# Patient Record
Sex: Female | Born: 1990 | Hispanic: Yes | Marital: Single | Smoking: Never smoker
Health system: Southern US, Community
[De-identification: ages and names within clinical notes are randomized; demographics above are authoritative.]

## PROBLEM LIST (undated history)

## (undated) DIAGNOSIS — Z789 Other specified health status: Secondary | ICD-10-CM

## (undated) HISTORY — PX: APPENDECTOMY: SHX54

---

## 2010-06-15 ENCOUNTER — Ambulatory Visit: Payer: Self-pay | Admitting: Family Medicine

## 2010-06-15 ENCOUNTER — Encounter: Payer: Self-pay | Admitting: Family Medicine

## 2010-06-15 LAB — CONVERTED CEMR LAB
Antibody Screen: NEGATIVE
Basophils Absolute: 0 K/uL
Basophils Relative: 0 %
Eosinophils Absolute: 0.1 K/uL
Eosinophils Relative: 2 %
HCT: 37.3 %
Hemoglobin: 12.3 g/dL
Hepatitis B Surface Ag: NEGATIVE
Lymphocytes Relative: 23 %
Lymphs Abs: 1.2 K/uL
MCHC: 33 g/dL
MCV: 89 fL
Monocytes Absolute: 0.4 K/uL
Monocytes Relative: 7 %
Neutro Abs: 3.6 K/uL
Neutrophils Relative %: 68 %
Platelets: 168 K/uL
RBC: 4.19 M/uL
RDW: 13.3 %
Rh Type: POSITIVE
Rubella: 35.3 [IU]/mL — ABNORMAL HIGH
Sickle Cell Screen: NEGATIVE
WBC: 5.3 10*3/microliter

## 2010-06-22 ENCOUNTER — Ambulatory Visit: Payer: Self-pay | Admitting: Family Medicine

## 2010-06-22 ENCOUNTER — Encounter: Payer: Self-pay | Admitting: Family Medicine

## 2010-06-22 DIAGNOSIS — N898 Other specified noninflammatory disorders of vagina: Secondary | ICD-10-CM | POA: Insufficient documentation

## 2010-06-22 LAB — CONVERTED CEMR LAB
Chlamydia, DNA Probe: NEGATIVE
GC Probe Amp, Genital: NEGATIVE
Pap Smear: NEGATIVE
Whiff Test: NEGATIVE

## 2010-07-06 ENCOUNTER — Encounter: Payer: Self-pay | Admitting: Family Medicine

## 2010-07-14 ENCOUNTER — Telehealth (INDEPENDENT_AMBULATORY_CARE_PROVIDER_SITE_OTHER): Payer: Self-pay | Admitting: Family Medicine

## 2010-07-14 ENCOUNTER — Telehealth: Payer: Self-pay | Admitting: Family Medicine

## 2010-07-24 ENCOUNTER — Ambulatory Visit: Payer: Self-pay | Admitting: Family Medicine

## 2010-07-27 ENCOUNTER — Telehealth: Payer: Self-pay | Admitting: *Deleted

## 2010-07-31 ENCOUNTER — Encounter: Payer: Self-pay | Admitting: Family Medicine

## 2010-07-31 ENCOUNTER — Ambulatory Visit (HOSPITAL_COMMUNITY): Admission: RE | Admit: 2010-07-31 | Discharge: 2010-07-31 | Payer: Self-pay | Admitting: Family Medicine

## 2010-08-24 ENCOUNTER — Ambulatory Visit: Payer: Self-pay | Admitting: Family Medicine

## 2010-09-21 ENCOUNTER — Ambulatory Visit: Payer: Self-pay | Admitting: Family Medicine

## 2010-09-21 ENCOUNTER — Encounter: Payer: Self-pay | Admitting: Family Medicine

## 2010-09-21 LAB — CONVERTED CEMR LAB
HCT: 35.9 % — ABNORMAL LOW (ref 36.0–46.0)
Hemoglobin: 11.4 g/dL — ABNORMAL LOW (ref 12.0–15.0)
MCHC: 31.8 g/dL (ref 30.0–36.0)
MCV: 92.3 fL (ref 78.0–100.0)
Platelets: 154 10*3/uL (ref 150–400)
RBC: 3.89 M/uL (ref 3.87–5.11)
RDW: 13.8 % (ref 11.5–15.5)
WBC: 7.8 10*3/uL (ref 4.0–10.5)

## 2010-10-11 ENCOUNTER — Ambulatory Visit: Payer: Self-pay | Admitting: Family Medicine

## 2010-10-13 ENCOUNTER — Inpatient Hospital Stay (HOSPITAL_COMMUNITY): Admission: AD | Admit: 2010-10-13 | Discharge: 2010-10-13 | Payer: Self-pay | Admitting: Obstetrics & Gynecology

## 2010-10-13 DIAGNOSIS — O2390 Unspecified genitourinary tract infection in pregnancy, unspecified trimester: Secondary | ICD-10-CM

## 2010-10-13 DIAGNOSIS — R109 Unspecified abdominal pain: Secondary | ICD-10-CM

## 2010-10-13 DIAGNOSIS — N390 Urinary tract infection, site not specified: Secondary | ICD-10-CM

## 2010-10-18 ENCOUNTER — Telehealth (INDEPENDENT_AMBULATORY_CARE_PROVIDER_SITE_OTHER): Payer: Self-pay | Admitting: *Deleted

## 2010-10-19 ENCOUNTER — Ambulatory Visit: Payer: Self-pay | Admitting: Family Medicine

## 2010-10-19 DIAGNOSIS — R1031 Right lower quadrant pain: Secondary | ICD-10-CM | POA: Insufficient documentation

## 2010-10-24 ENCOUNTER — Encounter: Payer: Self-pay | Admitting: Family Medicine

## 2010-10-24 ENCOUNTER — Ambulatory Visit: Payer: Self-pay | Admitting: Family Medicine

## 2010-11-08 ENCOUNTER — Ambulatory Visit: Payer: Self-pay | Admitting: Family Medicine

## 2010-11-23 ENCOUNTER — Ambulatory Visit: Payer: Self-pay | Admitting: Family Medicine

## 2010-11-23 ENCOUNTER — Encounter: Payer: Self-pay | Admitting: Family Medicine

## 2010-11-24 ENCOUNTER — Encounter: Payer: Self-pay | Admitting: Family Medicine

## 2010-11-24 LAB — CONVERTED CEMR LAB
Chlamydia, DNA Probe: NEGATIVE
GC Probe Amp, Genital: NEGATIVE

## 2010-12-06 ENCOUNTER — Ambulatory Visit: Payer: Self-pay | Admitting: Family Medicine

## 2010-12-14 ENCOUNTER — Ambulatory Visit: Admission: RE | Admit: 2010-12-14 | Discharge: 2010-12-14 | Payer: Self-pay | Source: Home / Self Care

## 2010-12-19 ENCOUNTER — Telehealth: Payer: Self-pay | Admitting: Family Medicine

## 2010-12-20 ENCOUNTER — Ambulatory Visit: Admission: RE | Admit: 2010-12-20 | Discharge: 2010-12-20 | Payer: Self-pay | Source: Home / Self Care

## 2010-12-25 ENCOUNTER — Encounter: Payer: Self-pay | Admitting: Family Medicine

## 2010-12-25 ENCOUNTER — Ambulatory Visit
Admission: RE | Admit: 2010-12-25 | Discharge: 2010-12-25 | Payer: Self-pay | Source: Home / Self Care | Attending: Family Medicine | Admitting: Family Medicine

## 2010-12-25 DIAGNOSIS — H538 Other visual disturbances: Secondary | ICD-10-CM | POA: Insufficient documentation

## 2010-12-25 LAB — CONVERTED CEMR LAB
Chlamydia, DNA Probe: NEGATIVE
GC Probe Amp, Genital: NEGATIVE
Whiff Test: NEGATIVE

## 2010-12-26 ENCOUNTER — Telehealth: Payer: Self-pay | Admitting: *Deleted

## 2010-12-26 ENCOUNTER — Ambulatory Visit
Admission: RE | Admit: 2010-12-26 | Discharge: 2010-12-26 | Payer: Self-pay | Source: Home / Self Care | Attending: Obstetrics & Gynecology | Admitting: Obstetrics & Gynecology

## 2010-12-29 ENCOUNTER — Ambulatory Visit: Admission: RE | Admit: 2010-12-29 | Discharge: 2010-12-29 | Payer: Self-pay | Source: Home / Self Care

## 2010-12-29 ENCOUNTER — Ambulatory Visit
Admission: RE | Admit: 2010-12-29 | Discharge: 2010-12-29 | Payer: Self-pay | Source: Home / Self Care | Attending: Obstetrics and Gynecology | Admitting: Obstetrics and Gynecology

## 2010-12-31 ENCOUNTER — Inpatient Hospital Stay (HOSPITAL_COMMUNITY)
Admission: AD | Admit: 2010-12-31 | Discharge: 2011-01-03 | Payer: Self-pay | Source: Home / Self Care | Attending: Obstetrics and Gynecology | Admitting: Obstetrics and Gynecology

## 2011-01-02 ENCOUNTER — Ambulatory Visit: Admit: 2011-01-02 | Payer: Self-pay | Admitting: Family Medicine

## 2011-01-02 LAB — RAPID HIV SCREEN (WH-MAU): Rapid HIV Screen: NONREACTIVE

## 2011-01-02 LAB — CBC
HCT: 37.4 % (ref 36.0–46.0)
HCT: 32 % — ABNORMAL LOW (ref 36.0–46.0)
Hemoglobin: 12.8 g/dL (ref 12.0–15.0)
Hemoglobin: 11 g/dL — ABNORMAL LOW (ref 12.0–15.0)
MCH: 29.6 pg (ref 26.0–34.0)
MCH: 29.6 pg (ref 26.0–34.0)
MCHC: 34.2 g/dL (ref 30.0–36.0)
MCHC: 34.4 g/dL (ref 30.0–36.0)
MCV: 86.6 fL (ref 78.0–100.0)
MCV: 86 fL (ref 78.0–100.0)
Platelets: 107 10*3/uL — ABNORMAL LOW (ref 150–400)
Platelets: 105 10*3/uL — ABNORMAL LOW (ref 150–400)
RBC: 4.32 MIL/uL (ref 3.87–5.11)
RBC: 3.72 MIL/uL — ABNORMAL LOW (ref 3.87–5.11)
RDW: 12.9 % (ref 11.5–15.5)
RDW: 12.9 % (ref 11.5–15.5)
WBC: 9.1 10*3/uL (ref 4.0–10.5)
WBC: 12 10*3/uL — ABNORMAL HIGH (ref 4.0–10.5)

## 2011-01-02 LAB — DIFFERENTIAL
Basophils Absolute: 0 10*3/uL (ref 0.0–0.1)
Basophils Relative: 0 % (ref 0–1)
Eosinophils Absolute: 0.1 10*3/uL (ref 0.0–0.7)
Eosinophils Relative: 1 % (ref 0–5)
Lymphocytes Relative: 18 % (ref 12–46)
Lymphs Abs: 1.6 10*3/uL (ref 0.7–4.0)
Monocytes Absolute: 0.9 10*3/uL (ref 0.1–1.0)
Monocytes Relative: 9 % (ref 3–12)
Neutro Abs: 6.6 10*3/uL (ref 1.7–7.7)
Neutrophils Relative %: 72 % (ref 43–77)

## 2011-01-02 LAB — TYPE AND SCREEN
ABO/RH(D): A POS
Antibody Screen: NEGATIVE

## 2011-01-02 LAB — ABO/RH: ABO/RH(D): A POS

## 2011-01-02 LAB — RUBELLA SCREEN: Rubella: 25.2 IU/mL — ABNORMAL HIGH

## 2011-01-02 LAB — HEPATITIS B SURFACE ANTIGEN: Hepatitis B Surface Ag: NEGATIVE

## 2011-01-02 LAB — RPR: RPR Ser Ql: NONREACTIVE

## 2011-01-02 LAB — AMNISURE RUPTURE OF MEMBRANE (ROM) NOT AT ARMC: Amnisure ROM: NEGATIVE

## 2011-01-05 ENCOUNTER — Inpatient Hospital Stay (HOSPITAL_COMMUNITY)
Admission: AD | Admit: 2011-01-05 | Discharge: 2011-01-05 | Payer: Self-pay | Source: Home / Self Care | Attending: Obstetrics & Gynecology | Admitting: Obstetrics & Gynecology

## 2011-01-09 NOTE — Progress Notes (Signed)
Summary: phn msg  Phone Note Call from Patient Call back at [facility] Phone [phone]   Caller: Patient Summary of Call: NOB having pink d/c since yesterday - not sure if that is normal Initial call taken by: De Nurse,  July 14, 2010 10:41 AM  Follow-up for Phone Call        spoke with patient and she reports pink discharge started yesterday. she goes to bathroom  to urinate and after she stands and washs her hands she feels the discharge come out. has happened several times like this. no cramping , vaginal itching.  had not had intercourse prior to this . Dr. Sheffield Slider advised and he does not feels patient needs to come in at this time given no contractions , etc,. she has a regularly scheduled appointment with PCP 07/20/2010.  patient asks about regular activities such as cleaning, walking , dancing. advise just  general light hosekeeping is fine , no heavy lifting , hold off on dancing for now until she sees pcp next week. Follow-up by: Theresia Lo RN,  July 14, 2010 11:18 AM  Additional Follow-up for Phone Call Additional follow up Details #1::        Will ask Dr Edmonia James to call her this PM to see if she wants her to see her in her clinic Additional Follow-up by: Zachery Dauer MD,  July 14, 2010 12:12 PM

## 2011-01-09 NOTE — Miscellaneous (Signed)
Summary: Orders Update  Clinical Lists Changes  Orders: Added new Test order of Other OB visit- [facility]) - Signed

## 2011-01-09 NOTE — Progress Notes (Signed)
  Phone Note Outgoing Call   Call placed by: Ferne Coe Call placed to: Patient Summary of Call: I called pt and let her know about her [place] appt at [facility] on 08/22 @1 :00pm

## 2011-01-09 NOTE — Assessment & Plan Note (Signed)
Summary: OB visit 29.2 weeks   Vital Signs:  Patient profile:   20 year old female Weight:      126 pounds Pulse rate:   87 / minute BP sitting:   111 / 74  (left arm) Cuff size:   regular  Vitals Entered By: Tessie Fass CMA (October 11, 2010 3:59 PM) CC: OB Visit   Primary Care Provider:  Ellin Mayhew MD  CC:  OB Visit.  History of Present Illness: Pt reports that she has vaginal discharge off and on during pregnancy.  She denies itching or foul odor.  She has had unprotected sex with FOB.  She states that she is not 100% sure that she is his only partner.  I recommended that we screen again for STD's but pt states that she didn't want a pelvic exam at today's visit. we reviewed the importance of protection during intercourse and the importance of screening for STD's during pregnancy.  Habits & Providers  Alcohol-Tobacco-Diet     Cigarette Packs/Day: n/a  Current Medications (verified): 1)  Prenatal/folic Acid  Tabs (Prenatal Vit-Fe Fumarate-Fa) .... Take 1 Tablet Daily  Allergies (verified): No Known Drug Allergies  Review of Systems       no fever. no n/v.  see ob flowsheet.  Physical Exam  General:  VSS Well-developed,well-nourished,in no acute distress; alert,appropriate and cooperative throughout examination Lungs:  Normal respiratory effort, chest expands symmetrically. Lungs are clear to auscultation, no crackles or wheezes. Heart:  Normal rate and regular rhythm. S1 and S2 normal without gallop, murmur, click, rub or other extra sounds.   Impression & Recommendations:  Problem # 1:  PREGNANCY, NORMAL (ICD-V22.2)  Pt is a G1P0- EDC 12/25/10 based on 19 week U/S.  Lab work:  A+, antibody-neg, rubella- immune, HIV- NR.    Pt states that she will allow me to do pelvic exam at next visit and screening for STD's.  Will review pain med options again at next visit.  Also will discuss pros of breastfeeding and birth control options at next appt.  BP WNL today  and weight gain appropriate.    Orders: Other OB visit- [facility] ([facility])  Complete Medication List: 1)  Prenatal/folic Acid Tabs (Prenatal vit-fe fumarate-fa) .... Take 1 tablet daily  Patient Instructions: 1)  Please make a follow up appt for in 2 weeks. 2)  Great to see you today!   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 years    Menses interval: irregular days    Menstrual flow 5 days    On BCP's at conception: no   Flowsheet View for Follow-up Visit    Estimated weeks of       gestation:     29 2/7    Weight:     126    Blood pressure:   111 / 74    Headache:     No    Nausea/vomiting:   No    Edema:     0    Vaginal bleeding:   no  Vaginal discharge:   d/c    Fundal height:      29    FHR:       140's    Fetal activity:     yes    Labor symptoms:   no    Taking prenatal vits?   Y    Smoking:     n/a    Next visit:     2 wk    Resident:     [facility] for Follow-up Visit    Estimated weeks of       gestation:     29 2/7    Weight:     126    Blood pressure:   111 / 74    Hx headache?     No    Nausea/vomiting?   No    Edema?     0    Bleeding?     no    Leakage/discharge?   d/c    Fetal activity:       yes    Labor symptoms?   no    Fundal height:      29    FHR:       140's    Taking Vitamins?   Y    Smoking PPD:   n/a    Next visit:     2 wk    Resident:     Edmonia James

## 2011-01-09 NOTE — Assessment & Plan Note (Signed)
Summary: left and low back discomfort, f/U ED visit 10/13/2010/ls   Vital Signs:  Patient profile:   20 year old female Weight:      128 pounds Temp:     98.4 degrees F oral Pulse rate:   83 / minute BP sitting:   105 / 67  (left arm) Cuff size:   regular  Vitals Entered By: Garen Grams LPN (October 19, 2010 9:38 AM) CC: Ed f/u for UTI and abd pain ob 30.4 Is Patient Diabetic? No Pain Assessment Patient in pain? no        Primary Care Provider:  Ellin Mayhew MD  CC:  Ed f/u for UTI and abd pain ob 30.4.  History of Present Illness: 1. Pregnant and lower abdominal pain: - Pt is [redacted] weeks pregnant.  She was seen at [facility] because of abdominal pain.  She was told that everything with the baby was fine but that she might have a UTI.  She was given a Rx of Keflex and took it for 5 days but then lost the prescription.  She was called by a nurse from [facility] saying that she didn't have a UTI and that she didn't need to take the abx.  The pain is located in the RLQ and in the right pelvis.  She denies any pain today.  The pain comes and goes.  Sometimes associated with movement.  Currently rated a 0/10.  ROS: denies dysuria, hematuria, back pain, vaginal discharge, vaginal itching, nausea, vomitting, diarrhea.  endorses good fetal movement.  Habits & Providers  Alcohol-Tobacco-Diet     Cigarette Packs/Day: n/a  Allergies: No Known Drug Allergies  Past History:  Past Medical History: Reviewed history from 06/22/2010 and no changes required. none  Social History: Reviewed history from 06/22/2010 and no changes required. Lives with boyfriend. pt's family in [place], boyfriends family local no alcohol, drugs, smoking.  Physical Exam  General:  VSS Well-developed,well-nourished,in no acute distress; alert,appropriate and cooperative throughout examination Lungs:  Normal respiratory effort, chest expands symmetrically. Lungs are clear to auscultation, no crackles or  wheezes. Heart:  Normal rate and regular rhythm. S1 and S2 normal without gallop, murmur, click, rub or other extra sounds. Abdomen:  gravid c/w dates.  Nontender.  No guarding or rebound. Skin:  turgor normal and color normal.   Psych:  not anxious appearing and not depressed appearing.   Additional Exam:  Reviewed UA and UCx results.  UCx with no growth.   Impression & Recommendations:  Problem # 1:  ABDOMINAL PAIN RIGHT LOWER QUADRANT (ICD-789.03) Assessment Improved Currently resolved.  Sounds more like round ligament pain.  No need to restart abx.  Continue to monitor.  F/U with PCP on Tuesday.  Problem # 2:  PREGNANCY, NORMAL (ICD-V22.2) Assessment: Unchanged  Doing well.  Good FHTs and good fetal movement.  F/U with PCP on Tuesday.  Orders: Other OB visit- [facility] ([facility])  Complete Medication List: 1)  Prenatal/folic Acid Tabs (Prenatal vit-fe fumarate-fa) .... 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 years    Menses interval: irregular days    Menstrual flow 5 days    On BCP's at conception: no   Flowsheet View for Follow-up Visit    Estimated weeks of       gestation:     30 3/7    Weight:     128    Blood pressure:   105 / 67    Headache:     No    Nausea/vomiting:   No    Edema:     0    Vaginal bleeding:   no    Vaginal discharge:   no    Fundal height:      30    FHR:       125    Fetal activity:     yes    Labor symptoms:   no    Taking prenatal vits?   Y    Smoking:     n/a    Next visit:     1 wk    Resident:     Lelon Perla

## 2011-01-09 NOTE — Assessment & Plan Note (Signed)
Summary: OB visit at 31 weeks   Vital Signs:  Patient profile:   20 year old female Weight:      127.9 pounds BP sitting:   108 / 75  Vitals Entered By: Arlyss Repress CMA, (October 24, 2010 1:33 PM)  Habits & Providers  Alcohol-Tobacco-Diet     Cigarette Packs/Day: n/a  Allergies: No Known Drug Allergies   Impression & Recommendations:  Problem # 1:  PREGNANCY, NORMAL (ICD-V22.2) 31 week OB visit: Pt is a G1P0- EDC 12/25/10 based on 19 week U/S.  Lab work:  A+, antibody-neg, rubella- immune, HIV- NR.    approriate weight gain and BP. pt denies any vaginal discharge.   Boy- "Ethelene Browns" pain management- epidural feeding- unsure about breastfeeding/ leaning towards formula  at next appt: will have pt set up an appt with OB clinic, discuss birth control options, discuss if she would like a circumcision for anthony, continue to encourage breast feeding.  Complete Medication List: 1)  Prenatal/folic Acid Tabs (Prenatal vit-fe fumarate-fa) .... 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 years    Menses interval: irregular days    Menstrual flow 5 days    On BCP's at conception: no   Flowsheet View for Follow-up Visit    Estimated weeks of       gestation:     31 1/7    Weight:     127.9    Blood pressure:   108 / 75    Headache:     No    Nausea/vomiting:   No    Edema:     0    Vaginal bleeding:   no    Vaginal discharge:   no    Fundal height:      31    FHR:       135    Fetal activity:     yes    Labor symptoms:   no    Taking prenatal vits?   Y  Smoking:     n/a    Next visit:     2 wk    Resident:     [facility] for Follow-up Visit    Estimated weeks of       gestation:     31 1/7    Weight:     127.9    Blood pressure:   108 / 75    Hx headache?     No    Nausea/vomiting?   No    Edema?     0    Bleeding?     no    Leakage/discharge?   no    Fetal activity:       yes    Labor symptoms?   no    Fundal height:      31    FHR:       135    Taking Vitamins?   Y    Smoking PPD:  n/a    Next visit:     2 wk    Resident:     Edmonia James

## 2011-01-09 NOTE — Assessment & Plan Note (Signed)
Summary: OB at 20.5 weeks   Vital Signs:  Patient profile:   20 year old female Weight:      111 pounds Pulse rate:   81 / minute BP sitting:   109 / 68  (left arm) Cuff size:   regular  Vitals Entered By: Tessie Fass CMA (July 24, 2010 2:14 PM) CC: OB Visit   CC:  OB Visit.  History of Present Illness: pt endorses some tearfulness during past few weeks.  Wants to know if this is normal.  Also asks if it is ok if she dances and exercises.  No   Habits & Providers  Alcohol-Tobacco-Diet     Cigarette Packs/Day: n/a  Allergies: No Known Drug Allergies  Physical Exam  Psych:  PHQ-9 score of 2 pregnancy medical home form- no red flags.   Impression & Recommendations:  Problem # 1:  PREGNANCY, NORMAL (ICD-V22.2) pt is a G1P0 at 20.5 weeks. PHQ-9 obtained- score of 2-  reassured pt that many women feel more tearful/emotional during pregnancy but that there is also an increased risk of depression among preg women so we need to monitor closely for this.  PHQ9 reassuring.  Will give repeat PHQ9 in future if needed to reassess.  Pt encouraged to exercise.  Instructed to avoid activities that would cause her to fall or to be hit in abdomen.   bp wnl.  weight gain appropriate.  Now has orange card so will obtain u/s.  Pt will need to be scheduled at [facility] clinic for next appt.   Orders: Other OB visit- [facility] ([facility]) Prenatal U/S > 14 weeks - 91478 (Prenatal U/S)  Patient Instructions: 1)  Think about who you would like to have for your baby's doctor. 2)  [email or web address] 3)  Your due date is 12/06/10--- 4)  return in 4 weeks for your next appt.    Flowsheet View for Follow-up Visit    Estimated weeks of       gestation:     20 5/7    Weight:     111    Blood pressure:   109 / 68    Hx headache?     +    Nausea/vomiting?   No    Edema?     0    Bleeding?     no    Leakage/discharge?   no    Fetal activity:       no    Labor symptoms?   no    Fundal height:      at  umbilicus    FHR:       140s    Taking Vitamins?   Y    Smoking PPD:   n/a    Next visit:     4 wk    Resident:     Edmonia James    Preceptor:     Mauricio Po

## 2011-01-09 NOTE — Assessment & Plan Note (Signed)
Summary: OB visit- 33.2 weeks   Vital Signs:  Patient profile:   20 year old female Weight:      131.4 pounds Temp:     98.8 degrees F oral Pulse rate:   86 / minute BP sitting:   104 / 69  (right arm) Cuff size:   regular  Vitals Entered By: Jimmy Footman, CMA (November 08, 2010 2:18 PM) CC: OB 33    2/7 Is Patient Diabetic? No Pain Assessment Patient in pain? no        CC:  OB 33    2/7.  Habits & Providers  Alcohol-Tobacco-Diet     Cigarette Packs/Day: n/a  Allergies: No Known Drug Allergies   Impression & Recommendations:  Problem # 1:  PREGNANCY, NORMAL (ICD-V22.2)  31 week OB visit: Pt is a G1P0- EDC 12/25/10 based on 19 week U/S.  Lab work:  A+, antibody-neg, rubella- immune, HIV- NR.    approriate weight gain and BP. Pt reports some rib pain off and on but no abd or rib pain on exam.    Boy- "Ethelene Browns" pain management- epidural feeding- unsure about breastfeeding- states that she will try to breastfeed at first Birth control- depo  at next appt: pt states that she would like me to do the pelvic exam for group b strep culture, will do this and GC/Chlam at next appt. will have pt set up an appt with OB clinic at following appt,  at next appt will discuss if she would like a circumcision for [facility] and will ask who will be anthony's doctor. continue to encourage breast feeding.  Orders: Other OB visit- [facility] ([facility])  Complete Medication List: 1)  Prenatal/folic Acid Tabs (Prenatal vit-fe fumarate-fa) .... Take 1 tablet daily  Patient Instructions: 1)  return in 2 weeks for next appt.  Make an appt with the OB clinic.   Orders Added: 1)  Other OB visit- [facility] [[facility]]     Flowsheet View for Follow-up Visit    Estimated weeks of       gestation:     33 2/7    Weight:     131.4    Blood pressure:   104 / 69    Hx headache?     No    Nausea/vomiting?   No    Edema?     0    Bleeding?     no    Leakage/discharge?   no    Fetal activity:       yes  Labor symptoms?   no    Fundal height:      33    FHR:       130s    Fetal position:      vertex    Taking Vitamins?   Y    Smoking PPD:   n/a    Next visit:     2 wk    Resident:     Edmonia James

## 2011-01-09 NOTE — Assessment & Plan Note (Signed)
Summary: OB visit 22.4   Vital Signs:  Patient profile:   20 year old female LMP:     03/01/2010 Weight:      117.8 pounds BP sitting:   109 / 74  History of Present Illness: Pt states that she doesn have white/clear discharge from time to time.  No itching.  No foul odor.  No exposure to STD's.  Offered to check for infection but pt declined at this time stating that she is not at risk of any STD's since no expousure.  No foul odor or itching so not symptomatic BV or yeast.   Pt states that she feel while getting something out of the refridgerator about 3 weeks ago. She did not get evaluated.  Pt has been moving well.  We talked about the importance of going to the emergency dept after a fall for evaluation.    Pt states that she has been less tearful and emotional.  Denies any problems with depression or "feeling down"  Instructed to let me know if she does have any return of these problems.   Habits & Providers  Alcohol-Tobacco-Diet     Cigarette Packs/Day: `n/a  Current Medications (verified): 1)  Prenatal/folic Acid  Tabs (Prenatal Vit-Fe Fumarate-Fa) .... Take 1 Tablet Daily  Allergies (verified): No Known Drug Allergies  Social History: Packs/Day:  `n/a  Review of Systems       as per hpi and ob flowsheet.  Physical Exam  General:  VSS Well-developed,well-nourished,in no acute distress; alert,appropriate and cooperative throughout examination Lungs:  Normal respiratory effort, chest expands symmetrically.  Abdomen:  gravid Extremities:  no edema Psych:  Cognition and judgment appear intact. Alert and cooperative with normal attention span and concentration. No apparent delusions, illusions, hallucinations   Impression & Recommendations:  Problem # 1:  PREGNANCY, NORMAL (ICD-V22.2) Pt given due date of 12/25/10 based on U/S at 19 weeks.  Pt had LMP on 03/01/10 but has irregular periods so this is not reliable- EDC based on LMP is  12/06/10.  Pt due date changed in  record to 12/25/10.  Will ensure that pt knows about this change at next appt.  Pt is to return in 4 weeks to be seen at [facility] clinic. Will do glucola at next visit.   Vaginal d/c most likely physiologic.  Pt instructed to go to ER if any falls in the future.  Will continue to monitor for depression.  No red flags at this time.    Orders: Other OB visit- [facility] ([facility])  Complete Medication List: 1)  Prenatal/folic Acid Tabs (Prenatal vit-fe fumarate-fa) .... Take 1 tablet daily  Patient Instructions: 1)  return for next appt in 4 weeks- make appt with the OB clinic. 2)  If you have any vaginal bleeding or abd pain [facility].   3)  You can always call the emergency line here at the clinic if any questions or concerns.  4)  We will get the glucola (diabetes) test at your next appt.    Flowsheet View for Follow-up Visit    Estimated weeks of       gestation:     22 4/7    Weight:     117.8    Blood pressure:   109 / 74    Hx headache?     No    Nausea/vomiting?   No    Edema?     0    Bleeding?     no  Leakage/discharge?   d/c    Fetal activity:       yes    Labor symptoms?   no    Fundal height:      22    FHR:       140    Taking Vitamins?   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 years    Menses interval: irregular days    Menstrual flow 5  days    On BCP's at conception: no   Flowsheet View for Follow-up Visit    Estimated weeks of       gestation:     22 4/7    Weight:     117.8    Blood pressure:   109 / 74    Headache:     No    Nausea/vomiting:   No    Edema:     0    Vaginal bleeding:   no    Vaginal discharge:   d/c    Fundal height:      22    FHR:       140    Fetal activity:     yes    Labor symptoms:   no    Taking prenatal vits?   Y    Smoking:     `n/a    Next visit:     4 wk    Resident:     Edmonia James    Preceptor:     Alfonse Ras

## 2011-01-09 NOTE — Assessment & Plan Note (Signed)
Summary: 26 weeks   Vital Signs:  Patient profile:   20 year old female Weight:      120.9 pounds BP sitting:   112 / 78  Vitals Entered By: Arlyss Repress CMA, (September 21, 2010 11:03 AM)  Habits & Providers  Alcohol-Tobacco-Diet     Cigarette Packs/Day: n/a  Allergies: No Known Drug Allergies  Social History: Packs/Day:  n/a   Impression & Recommendations:  Problem # 1:  PREGNANCY, NORMAL (ICD-V22.2) Doing well.  Revewed ultrasound with pt and partner.  Reviewed PTL precautions and kick counts.  Flu shot today.  Will check varicella titers as pt is unsure if she has had the disease or vaccine.  F/u 3 weeks with Dr. Edmonia James. Orders: CBC-[facility] (16109) HIV-[facility] ([id number]) RPR-[facility] [id number]) Glucose 1 hr-[facility] (13086) Miscellaneous Lab Charge-[facility] (57846) Other OB visit- [facility] ([facility])  Complete Medication List: 1)  Prenatal/folic Acid Tabs (Prenatal vit-fe fumarate-fa) .... Take 1 tablet daily  Patient Instructions: 1)  If you have bleeding, if your water breaks, if the baby is not moving well, or if you have contractions, please go to [facility] to be checked. 2)  We will check some labs today, including the sugar test to look for diabetes. 3)  Please make an appt in 3 weeks with Dr. Katrinka Blazing.   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 years    Menses interval: irregular days    Menstrual flow 5 days    On BCP's at conception: no   Flowsheet View for Follow-up Visit    Estimated weeks of       gestation:     26 3/7    Weight:     120.9    Blood pressure:   112 / 78    Headache:     No    Nausea/vomiting:   No    Edema:     0    Vaginal bleeding:    no    Vaginal discharge:   no    Fundal height:      26    FHR:       130s    Fetal activity:     yes    Taking prenatal vits?   Y    Smoking:     n/a  Prenatal Visit Concerns noted: Wants to know u/s results.  Doing well.  No other concerns.     Orders Added: 1)  CBC-[facility] [85027] 2)  HIV-[facility] [[id number]] 3)  RPR-[facility] [[id number]] 4)  Glucose 1 hr-[facility] [82950] 5)  Miscellaneous Lab Charge-[facility] [99999] 6)  Other OB visit- [facility] [[facility]]

## 2011-01-09 NOTE — Miscellaneous (Signed)
Summary: clinical update  Clinical Lists Changes  Observations: Added new observation of MENARCHE: ? 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 years    Menses interval: irregular days    Menstrual flow 5 days    On BCP's at conception: no

## 2011-01-09 NOTE — Assessment & Plan Note (Signed)
Summary: NOB   Vital Signs:  Patient profile:   20 year old female LMP:     03/01/2010 Weight:      106.5 pounds BP sitting:   105 / 69  Vitals Entered By: Jimmy Footman, CMA (June 22, 2010 3:42 PM) CC: New OB visit Is Patient Diabetic? No LMP (date): 03/01/2010 EDC by LMP==> 12/06/2010 EDC 12/06/2010 LMP - Reliable? Yes Menarche (age onset years): 3   Menses interval (days): irregular Menstrual flow (days): 5 On BCP's at conception: no Enter LMP: 03/01/2010   CC:  New OB visit.  History of Present Illness: Pt states that she is here for her first prenatal visit.  She states that she doesn't have any concerns with this pregnancy.  She states that she is having some morning sickness but that it is tolerable.    Habits & Providers  Alcohol-Tobacco-Diet     Tobacco Status: never     Cigarette Packs/Day: n/a  Current Medications (verified): 1)  None  Allergies (verified): No Known Drug Allergies  Past History:  Past Medical History: none  Past Surgical History: Appendectomy  Family History: maternal GM- Cancer brain no family hx of diabetes Father- high cholesterol  Social History: Lives with boyfriend. pt's family in [place], boyfriends family local no alcohol, drugs, smoking. Smoking Status:  never Education:  12 years Packs/Day:  n/a Hepatitis Risk:  no  Physical Exam  General:  VSS Well-developed,well-nourished,in no acute distress; alert,appropriate and cooperative throughout examination Lungs:  Normal respiratory effort, chest expands symmetrically. Lungs are clear to auscultation, no crackles or wheezes. Heart:  Normal rate and regular rhythm. S1 and S2 normal without gallop, murmur, click, rub or other extra sounds. Abdomen:  soft, non-tender, and normal bowel sounds.  fundal height approx 16cm Genitalia:  Pelvic Exam:        External: normal female genitalia without lesions or masses        Vagina: normal without lesions or masses  Cervix: normal without lesions or masses        Adnexa: normal bimanual exam without masses or fullness        Uterus: approrpiate for gestational age.        Pap smear: performed Msk:  normal gait Pulses:  2 + Extremities:  no edema Psych:  No signs or symptoms of depression.Oriented X3, normally interactive, and good eye contact.     Impression & Recommendations:  Problem # 1:  PREGNANCY, NORMAL (ICD-V22.2) Pt is a  20 y.o. G1 P0 who is here for a NOB appt.  Pt EDC 12/06/10  based on LMP.  This makes her 16.[redacted] weeks pregnant at this appt.  Will order U/S once pt  has applied for [facility] card in order to eval for dates.  Pt is sure of LMP but periods are irregular and unreliable at baseline.  Did not discuss 2nd trimester genetic screening with pt at today's appt.  Will call pt and discuss these options with her.  All routine prenatal screening labs reviewed.  No red flags.   Urine Cx negative.  Pt given red flags for return.  Pt is a good canidate for prject launch at next appt will see if her baby will be coming to our clinic for care, if so will refer pt.  Pt encouraged to eat small frequent meals and continue prenatal vitamins.  Orders: GC/Chlamydia-[facility] ([id number]) Pap Smear-[facility] ([id number]) Other OB visit- [facility] ([facility])  [id number]  Patient Instructions: 1)  please call and set up an appt in  4 weeks.  2)  Currently your due date is 12/06/10 but we need to confirm with ultrasound since your periods are usually irregular.   3)  After you get your orange card we can schedule you for an ultrasound so do this ASAP. 4)  If any vaginal bleeding, vaginal discharge, contractions please call the clinic    Flowsheet View for Follow-up Visit    Estimated weeks of       gestation:     16 1/7    Weight:     106.5    Blood pressure:   105 / 69    Hx headache?     few    Nausea/vomiting?   vom1/d    Edema?     0    Bleeding?     no    Leakage/discharge?    d/c    Fetal activity:       no    Labor symptoms?   no    Fundal height:      16    FHR:       140s    Taking Vitamins?   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 : Yes    Menarche: 3 years    Menses interval: irregular days    Menstrual flow 5 days    On BCP's at conception: no    Pre Pregnancy Weight: 103 lbs.    Symptoms since LMP: amenorrhea, nausea, vomiting, fatigue, irritability, tender breasts, urinary frequency   Flowsheet View for Follow-up Visit    Estimated weeks of       gestation:     16 1/7    Weight:     106.5    Blood pressure:   105 / 69    Headache:     few    Nausea/vomiting:   vom1/d    Edema:     0    Vaginal bleeding:   no    Vaginal discharge:   d/c    Fundal height:      16    FHR:       140s    Fetal activity:     no    Labor symptoms:   no    Taking prenatal vits?   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 : Yes    Menarche: 3 years    Menses interval: irregular days    Menstrual flow 5  days    On BCP's at conception: no    Pre Pregnancy Weight: 103 lbs.    Symptoms since LMP: amenorrhea, nausea, vomiting, fatigue, irritability, tender breasts, urinary frequency   Past Pregnancy History    Gravida:     1    Term Births:     0    Premature Births:   0    Para:       0    Mult. Births:     0    Prev C-Section:   0    Aborta:     0    Elect. Ab:     0    Spont. Ab:     0  Pregnancy # 1    Comments:     current pregnacy   Genetic History    Father of baby:   Huel Cote     Thalassemia:     mother: no   father: no    Neural tube defect:   mother: no   father: no    Down's Syndrome:   mother: no   father: no    Tay-Sachs:     mother: no   father: no    Sickle Cell Dz/Trait:   mother: no   father: no    Hemophilia:     mother: no   father: no    Muscular Dystrophy:   mother: no   father: no    Cystic Fibrosis:   mother: no   father: no    Huntington's Dz:   mother: no   father: no    Mental Retardation:   mother: no   father: no    Fragile X:     mother: no   father: no    Other Genetic or       Chromosomal Dz:   mother: no   father: no    Child with other       birth defect:     mother: no   father: no    > 3 spont. abortions:   mother: no    Hx of stillbirth:     mother: no  Infection Risk History    High Risk Hepatitis B: no    Exposure to TB: no    Patient with history of Genital Herpes: no    Sexual partner with history of Genital Herpes: no    History of STD (GC, Chlamydia, Syphilis, HPV): no    Rash, Viral, or Febrile Illness since LMP: yes    Exposure to Cat Litter: no    Chicken Pox Immune Status: Hx of Disease: Immune    History of Parvovirus (Fifth Disease): no  Environmental Exposures    Xray Exposure since LMP: no    Chemical or other exposure: no    Medication, drug, or alcohol use since LMP: no  Additional Infection/Environmental Comments:    had runny nose and sore throat 1 month ago. No fever  Cleaning chemical exposure  only    Prenatal Visit    FOB name: Huel Cote  EDC Confirmation:    [facility]: 12/06/2010    LMP reliable? Yes    Last menses onset (LMP) date: 03/01/2010    [facility] by LMP: 12/06/2010    Laboratory Results  Date/Time Received: June 22, 2010 4:36PM  Date/Time Reported: June 22, 2010 5:03 PM   [employer] Source: vaginal WBC/hpf: 1-5 Bacteria/hpf: 2+  Rods Clue cells/hpf: none  Negative whiff Yeast/hpf: none Trichomonas/hpf: none Comments: ...........test performed by...........Marland KitchenTerese Door, CMA    Appended Document: NOB Attempt to call pt and discuss genetic screening options--phone number in computer incorrect/wrong number. Keyuna Cuthrell S.Edmonia James, MD

## 2011-01-09 NOTE — Progress Notes (Signed)
Summary: phn msg  Phone Note Call from Patient Call back at [facility] Phone [phone] Call back at [phone]   Caller: Interpretor-Noellea Summary of Call: Pt has orange card now and the ultra sound can now be scheduled. Initial call taken by: Clydell Hakim,  July 14, 2010 11:42 AM

## 2011-01-09 NOTE — Progress Notes (Signed)
Summary: meds prob  Phone Note Call from Patient Call back at Home Phone [phone]   Caller: Patient Summary of Call: pt went [facility] ED on Friday and they gave her some meds that she has to take and her husband threw the bottle away and she needs to take it for the next 2 days- not sure what she should do Initial call taken by: De Nurse,  October 18, 2010 3:06 PM  Follow-up for Phone Call        recieved records from [facility]  and patient also states she was given kelfex . she was seen in ED 10/13/2010 and took antibiotic for 4 days . husband discarded by mistake.  will send to preceptor to ask if he will refill . pharmacy is [facility] [address] Follow-up by: Theresia Lo RN,  October 18, 2010 3:20 PM  Additional Follow-up for Phone Call Additional follow up Details #1::        Dr. Sheffield Slider reviewed records and urine culture rport showed no growth. he states patient does not need any further antibiotic for UTI. patient notified . she states she is still having same symptom, discomfort left hip low back area. appointment scheduled tomorrow for recheck. Additional Follow-up by: Theresia Lo RN,  October 18, 2010 3:34 PM

## 2011-01-11 NOTE — Miscellaneous (Signed)
Summary: use of eyedrops  Clinical Lists Changes had irritated eyes & bought eyedrops. visine. read on back to ask md. told her I would check with md & call her back.Golden Circle RN  December 25, 2010 10:19 AM  I don't know what formulation of Visine she has, but most are category C in pregnancy (we don't have much information about the effects of the medicine on pregnancy).  It is probably best to not use them in pregnancy if she can avoid it. Sarah Swaziland MD  December 25, 2010 10:36 AM  told her above & advised seeing md. then she told me she was having difficulty seeing out of one eye, thinks there is something in it. she will be here in 20 minutes to see work in [employer] RN  December 25, 2010 10:40 AM

## 2011-01-11 NOTE — Progress Notes (Signed)
Summary: called pt/ts  ---- Converted from flag ---- ---- 12/26/2010 9:12 AM, Arlyss Repress CMA, wrote:   ---- 12/26/2010 9:10 AM, Milinda Antis MD wrote: Please call pt today and let her know all of her studies for her discharge ge were normal, her gonorrhea and chlamydia were neg, so was her wet prep for trichomonas ------------------------------  called pt and informed. Marland KitchenMarland Kitchen

## 2011-01-11 NOTE — Assessment & Plan Note (Signed)
Summary: 39.2 OB visit  pt agreed to video precepting.Loralee Pacas CMA  December 20, 2010 2:00 PM  Vital Signs:  Patient profile:   20 year old female Weight:      144 pounds Temp:     98.4 degrees F oral Pulse rate:   92 / minute Pulse rhythm:   regular BP sitting:   112 / 74  (right arm) Cuff size:   regular  Vitals Entered By: Loralee Pacas CMA (December 20, 2010 1:57 PM) CC: ob   Primary Care Provider:  Ellin Mayhew MD  CC:  ob.  History of Present Illness: Pt states that she called yesterday b/c she was concerned that the baby wasn't moving well.  She was told to go to [facility] hospital but then she felt like the kick counts improved so she decided not to [facility].  She states that she is still concerned b/c the baby seems to be moving less than before but counts at least 10 movements/per hour.  Habits & Providers  Alcohol-Tobacco-Diet     Cigarette Packs/Day: n/a  Current Medications (verified): 1)  Prenatal/folic Acid  Tabs (Prenatal Vit-Fe Fumarate-Fa) .... Take 1 Tablet Daily  Allergies (verified): No Known Drug Allergies  Review of Systems  The patient denies anorexia, fever, decreased hearing, dyspnea on exertion, and peripheral edema.     Impression & Recommendations:  Problem # 1:  PREGNANCY, NORMAL (ICD-V22.2) 39.2 week OB visit: Pt is a G1P0- [facility] 12/25/10 based on  U/S.  Lab work:  A+, antibody-neg, rubella- immune, HIV- NR.  Group B strep +  BP wnl.  weight gain appropriate.  Pt measurement continues to be 1cm less than expected- but trending up.   group b strep +.  During abd exam- performed kick count- kick count of 5 within 1 -2 min.  Boy- "Ethelene Browns no circumcision [facility] will be Anthony's pediatrian pain management- epidural feeding- unsure about breastfeeding- states that she will try to breastfeed at first Birth control- depo limited understanding about birth process-12/14/09- teaching provided on cervical dilitation,  contractions, placenta.   at next appt: pt will be 40 weeks at next visit.  Will need to set up 2 BPP's at [facility] between 40-41 weeks.  Will also need to schedule her induction date.    Orders: Other OB visit- [facility] (OBCK)  Complete  Orders: Other OB visit- [facility] (OBCK)  Complete Medication List: 1)  Prenatal/folic Acid Tabs (Prenatal vit-fe fumarate-fa) .... Take 1 tablet daily   Orders Added: 1)  Other OB visit- [facility] [OBCK]      Flowsheet View for Follow-up Visit    Estimated weeks of       gestation:     39 2/7    Weight:     144    Blood pressure:   112 / 74    Headache:     No    Nausea/vomiting:   No    Edema:     0    Vaginal bleeding:   no    Vaginal discharge:   no    Fundal height:      38    FHR:       150    Fetal activity:     yes    Labor symptoms:   few ctx    Fetal position:     vertex    Taking prenatal vits?   Y    Smoking:     n/a  Next visit:     1 wk    Resident:     [facility] for Follow-up Visit    Estimated weeks of       gestation:     39 2/7    Weight:     144    Blood pressure:   112 / 74    Hx headache?     No    Nausea/vomiting?   No    Edema?     0    Bleeding?     no    Leakage/discharge?   no    Fetal activity:       yes    Labor symptoms?   few ctx    Fundal height:      38    FHR:       150    Fetal position:      vertex    Taking Vitamins?   Y    Smoking PPD:   n/a    Next visit:     1 wk    Resident:     Edmonia James

## 2011-01-11 NOTE — Miscellaneous (Signed)
Summary: induction scheduled    Pt is scheduled for induction on January 23rd, 2012--at 7:30pm.  Staff to page myself for orders.  I have asked staff to schedule pt for 2 BPP's during the upcoming week prior to induction to monitor postdates infant.   Ellin Mayhew MD  December 25, 2010 11:44 AM

## 2011-01-11 NOTE — Assessment & Plan Note (Signed)
Summary: OB visit 40.4 weeks   Vital Signs:  Patient profile:   20 year old female Weight:      147 pounds Temp:     97.6 degrees F oral BP sitting:   110 / 60  (left arm) Cuff size:   regular  Vitals Entered By: Tessie Fass CMA (December 29, 2010 1:44 PM) CC: OB Pain Assessment Patient in pain? no        Primary Care Provider:  Ellin Mayhew MD  CC:  OB.  History of Present Illness: no concerns at today's appt-  states that she is just nervous about the delivery.  FOB at today's appt. Continues to have regular vaginal discharge- consistent with througout pregancy.   Habits & Providers  Alcohol-Tobacco-Diet     Cigarette Packs/Day: n/a  Current Medications (verified): 1)  Prenatal/folic Acid  Tabs (Prenatal Vit-Fe Fumarate-Fa) .... Take 1 Tablet Daily  Allergies (verified): No Known Drug Allergies  Review of Systems       as per ob flowsheet  Physical Exam  Abdomen:  gravid  Extremities:  no edema Psych:  Oriented X3, memory intact for recent and remote, normally interactive, and good eye contact.     Impression & Recommendations:  Problem # 1:  PREGNANCY, NORMAL (ICD-V22.2)  40.4 week OB visit: Pt is a G1P0- [facility] 12/25/10 based on  U/S.  Lab work:  A+, antibody-neg, rubella- immune, HIV- NR.  Group B strep +  BP wnl.  weight gain appropriate.  measurement wnl.    group b strep +.  BPP x 2 in past week both WNL.  Scheduled for induction on monday Jan 23rd at 7:30 pm  Boy- "Ethelene Browns no circumcision [facility] will be Anthony's pediatrian pain management- epidural feeding- unsure about breastfeeding- states that she will try to breastfeed at first Birth control- depo limited understanding about birth process-12/14/09 and 12/29/09- teaching provided on cervical dilitation, contractions, placenta.  Orders: Other OB visit- [facility] (OBCK)  Complete Medication List: 1)  Prenatal/folic Acid Tabs (Prenatal vit-fe fumarate-fa) .... Take 1 tablet  daily  Patient Instructions: 1)  Induction scheduled for Monday Jan 23rd at 7:30pm   Orders Added: 1)  Other OB visit- [facility] [OBCK]     Flowsheet View for Follow-up Visit    Estimated weeks of       gestation:     40 4/7    Weight:     147    Blood pressure:   110 / 60    Hx headache?     No    Nausea/vomiting?   No    Edema?     0    Bleeding?     no    Leakage/discharge?   d/c    Fetal activity:       yes    Labor symptoms?   few ctx    Fundal height:      39    FHR:       145    Fetal position:      vertex    Taking Vitamins?   Y    Smoking PPD:   n/a    Next visit:     3 d    Resident:     Edmonia James

## 2011-01-11 NOTE — Assessment & Plan Note (Signed)
Summary: OB/KH   Vital Signs:  Patient profile:   20 year old female Weight:      136.5 pounds BP sitting:   111 / 71  Vitals Entered By: Arlyss Repress CMA, (November 23, 2010 9:28 AM)  Habits & Providers  Alcohol-Tobacco-Diet     Cigarette Packs/Day: n/a  Allergies: No Known Drug Allergies   Impression & Recommendations:  Problem # 1:  PREGNANCY, NORMAL (ICD-V22.2) Primagravida at 35 3/7 weeks by [place] on 07/31/10; doing well.  Reviewed [place], prenatal labs.  Discussed birth spacing, plans for Depo Provera.  Vertex fetal position. GC/Chlamydia and GBS performed today. For followup in 1-2 weeks. Orders: Grp B Probe-[facility] ([id number]) GC/Chlamydia-[facility] ([id number]) Other OB visit- [facility] (OBCK)  Complete Medication List: 1)  Prenatal/folic Acid Tabs (Prenatal vit-fe fumarate-fa) .... Take 1 tablet daily  Patient Instructions: 1)  It was a pleasure to see you today!  YOu are at 35 weeks and 3 days. 2)  Today we did cervical cultures, as well as a vaginal swab for a bacteria called Group B Strep.  3)  I am glad you are considering [facility] for Anthony's routine medical care.  4)  Please make an appointment to see Dr Edmonia Morenike Cuff in 1 week for another OB visit.   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 years    Menses interval: irregular days    Menstrual flow 5 days    On BCP's at conception: no   Flowsheet View for Follow-up Visit    Estimated weeks of       gestation:     35 3/7    Weight:     136.5    Blood pressure:   111 /  71    Headache:     No    Nausea/vomiting:   No    Edema:     0    Vaginal bleeding:   no    Vaginal discharge:   no    Fundal height:      35    FHR:       140    Fetal activity:     yes    Labor symptoms:   no    Fetal position:     vertex    Taking prenatal vits?   Y    Smoking:     n/a    Next visit:     1 wk    Preceptor:     Mauricio Po    OB Initial Intake Information    Race: Hispanic    Marital status: Single    Occupation: not working    Education (last grade completed): 12 years  FOB Information    Husband/Father of baby: Huel Cote    FOB occupation Roofing    Phone: [phone]    FOB Comments: Lives with pt  Menstrual History    LMP (date): 03/01/2010    Menarche: ? years    Menses interval: irregular  days    Menstrual flow 5 days    On BCP's at conception: no   Flowsheet View for Follow-up Visit    Estimated weeks of       gestation:     35 3/7    Weight:     136.5    Blood pressure:   111 / 71    Hx headache?     No    Nausea/vomiting?   No    Edema?     0    Bleeding?     no    Leakage/discharge?   no    Fetal activity:       yes    Labor symptoms?   no    Fundal height:      35    FHR:       140    Fetal position:      vertex    Taking Vitamins?   Y    Smoking PPD:   n/a    Next visit:     1 wk    Preceptor:     Mauricio Po

## 2011-01-11 NOTE — Assessment & Plan Note (Signed)
Summary: OB visit 37.2 weeks   Vital Signs:  Patient profile:   20 year old female Weight:      140.1 pounds Temp:     98.2 degrees F oral Pulse rate:   85 / minute BP sitting:   109 / 73  (right arm) Cuff size:   regular  Vitals Entered By: Jimmy Footman, CMA (December 06, 2010 10:41 AM) CC: OB   37   2/7 Is Patient Diabetic? No Pain Assessment Patient in pain? no        CC:  OB   37   2/7.  Habits & Providers  Alcohol-Tobacco-Diet     Tobacco Status: never     Cigarette Packs/Day: n/a  Allergies: No Known Drug Allergies   Impression & Recommendations:  Problem # 1:  PREGNANCY, NORMAL (ICD-V22.2)  37.2 week OB visit: Pt is a G1P0- [facility] 12/25/10 based on  U/S.  Lab work:  A+, antibody-neg, rubella- immune, HIV- NR.  Group B strep +  BP wnl.  weight gain increased the past 2 weeks will continue to monitor.  Pt measurement 1-2 cm less than expected.  Will continue to monitor measurements.  group b strep +.  Boy- "Ethelene Browns no circumcision pain management- epidural feeding- unsure about breastfeeding- states that she will try to breastfeed at first Birth control- depo  at next appt: at next appt willl ask if she has decided on who will be anthony's doctor. continue to encourage breast feeding.  reinforce teaching--review basic concepts of cervical dilation, contractions, placenta, and delivery process.  pt has very limited understanding of this.  Orders: Other OB visit- [facility] ([facility]BCK)  Complete Medication List: 1)  Prenatal/folic Acid Tabs (Prenatal vit-fe fumarate-fa) .... Take 1 tablet daily  Patient Instructions: 1)  return in 1 week for next appt. 2)  remember red flags for return.   Orders Added: 1)  Other OB visit- [facility] [OBCK]     Flowsheet View for Follow-up Visit    Estimated weeks of       gestation:     37 2/7    Weight:     140.1    Blood pressure:   109 / 73    Hx headache?     No    Nausea/vomiting?   No    Edema?     0    Bleeding?     no   Leakage/discharge?   no    Fetal activity:       yes    Labor symptoms?   few ctx    Fundal height:      36    FHR:       145    Fetal position:      vertex    Taking Vitamins?   Y    Smoking PPD:   n/a    Next visit:     1 wk    Resident:     Edmonia James

## 2011-01-11 NOTE — Assessment & Plan Note (Signed)
Summary: blurry vision in one eye/[place]/caviness   Vital Signs:  Patient profile:   20 year old female Weight:      144.8 pounds Temp:     98.1 degrees F oral Pulse rate:   81 / minute BP sitting:   122 / 95  (left arm) Cuff size:   regular  Vitals Entered By: Tessie Fass CMA (December 25, 2010 11:38 AM)  Serial Vital Signs/Assessments:  Time      Position  BP       Pulse  Resp  Temp     By                     118/78                         Milinda Antis MD  CC: blurred vision, left eye   Primary Care Provider:  Ellin Mayhew MD  CC:  blurred vision and left eye.  History of Present Illness: Last nite, pt  used a baby wipe to wash her face and splashed the liquid in her left eye, she flushed it with water,  but has had persistant irritation, burning, and redness. +blurry vision and pain , somewhat improved his AM  No HA, no Fever, no NV  Also concerned about yellow/green discharge, married, states she is monogoumous but would not give direct answer about her Husband, mild itching, increased discharge from baseline, no bleeding,, occ contractions, wants to be checked for STD     [phone]-- [facility] [address]  Habits & Providers  Alcohol-Tobacco-Diet     Cigarette Packs/Day: n/a  Current Medications (verified): 1)  Prenatal/folic Acid  Tabs (Prenatal Vit-Fe Fumarate-Fa) .... Take 1 Tablet Daily  Allergies (verified): No Known Drug Allergies  Past History:  Past Medical History: Last updated: 06/22/2010 none  Review of Systems       Per HI  Physical Exam  General:  VSS Well-developed,well-nourished,in no acute distress; alert,appropriate and cooperative throughout examination Blood pressure rechecked Eyes:  Bilateral fundoscopic exam normal PERRL EOMI Neg- flourasecine test to left eye, no abrasions noted or foreign body Good tearing bilat   SNELLEN 20/20 right eye, unable to do on left eye, continued blinking, but grossly vision blurred but  intact peripheral vision in tact  Erythema of sclerea- left Neck:  supple Lungs:  CTAB Heart:  RRR Genitalia:  Thick , copious amounts of yellow discharge, no bleeding noted, no CMT discomoft with cervical check see below  no vaginal lesions Cervix- increased vascularity   Impression & Recommendations:  Problem # 1:  OTHER SPECIFIED VISUAL DISTURBANCES (ICD-368.8) Assessment New  Secondary to irritant, no foreign body seen today, will continue to flush with TAP water, vision improving, given red flags, no signs of infection.  Recheck vision at f/u visit and document as needed   Orders: [facility]- Est  Level 4 (62952)  Problem # 2:  PREGNANCY, NORMAL (ICD-V22.2) Assessment: Unchanged  40 weeks Pt is a G1P0- [facility] 12/25/10 based on  U/S.  Lab work:  A+, antibody-neg, rubella- immune, HIV- NR.  Group B strep + Growth following trend, no signs of active labor, discussed labor signs and expectant management at the hospital as well as induction. BPP/NST scheduled RTC 4 days for recheck Induction set by PCP  Boy- "Ethelene Browns no circumcision [facility] will be Anthony's pediatrian pain management- epidural feeding- unsure about breastfeeding- states that she will try to breastfeed at first Birth control-  depo limited understanding about birth process-12/14/09- teaching provided on cervical dilitation, contractions, placenta.   at next appt: pt will be 40 weeks at next visit.  Will need to set up 2 BPP's at [facility] hospital between 40-41 weeks.  Will also need to schedule her induction date.    Orders: Other OB visit- [facility] (O[facility]K)  Complete  Orders: Other OB visit- [facility])  Orders: Fetal Biophysical profile w/ NST - [id number] (Fetal Bio) GC/Chlamydia-[facility] (87591/87491) Wet Prep- [facility] (58099) [facility]- Est  Level 4 (83382)  Problem # 3:  VAGINAL DISCHARGE (ICD-623.5) Assessment: New  Wet prep neg, will f/u GC/Chlamydia, neg at 36 weeks, pt could not give a complete answer about  whether her husband was monogomous. Will await culture, treat as needed, lots of WBC  Orders: [facility]- Est  Level 4 (99214)  Complete Medication List: 1)  Prenatal/folic Acid Tabs (Prenatal vit-fe fumarate-fa) .... Take 1 tablet daily  Patient Instructions: 1)  Your next appointment is on Friday , please schedule with Dr. Edmonia James 2)  If you do not feel the baby moving then do kick counts 3)  You should feel 5-10 kicks in 1 hour, if not drink a sweet beverage or cold beverage and then wait for movement. 4)  If you have bleeding, your water breaks or severe pain [facility] 5)  You have your BPP appt to check the movement, and fluid levels for the baby 6)  We will call you with the results of your tests today 7)  You can rinse your eye with tap water a few days, if your vision does not improve by tomorrow please come back to be seen earlier    Orders Added: 1)  Fetal Biophysical profile w/ NST - 50539 [Fetal Bio] 2)  GC/Chlamydia-[facility] [87591/87491] 3)  Wet Prep- [facility] [87210] 4)  [facility]- Est  Level 4 [76734]     Flowsheet View for Follow-up Visit    Estimated weeks of       gestation:     40 0/7    Weight:     144.8    Blood pressure:   122 / 95    Hx headache?     No    Nausea/vomiting?   No    Edema?     0    Bleeding?     no    Leakage/discharge?   d/c yellow green, mild itching    Fetal activity:       yes    Labor symptoms?   few ctx    Fundal height:      39    FHR:       140's    Fetal position:      vertex    Cx dilation:     1    Cx effacement:   20%    Fetal station:     high    Taking Vitamins?   Y    Smoking PPD:   n/a    Next visit:     4 days     Resident:     [facility]    Preceptor:     [place]  Laboratory Results  Date/Time Received: December 25, 2010 12:10 PM  Date/Time Reported: December 25, 2010 12:36 PM   [employer] Source: vag WBC/hpf: LOADED Bacteria/hpf: 3+  rods and cocci Clue cells/hpf: few  Negative whiff Yeast/hpf:  few Trichomonas/hpf: none Comments: ...............test performed by......Marland KitchenBonnie A. [place], MLS (ASCP)cm

## 2011-01-11 NOTE — Progress Notes (Signed)
Summary: Triage  Phone Note Call from Patient Call back at Home Phone [phone]   Reason for Call: Talk to Nurse Summary of Call: pt sts baby is not using as much as usual, wants to know if this is normal? Initial call taken by: Knox Royalty,  December 19, 2010 2:20 PM  Follow-up for Phone Call        pregnant. 38 wks . less activity from baby than usual since the weekend.  has appt tomorrow. she thinks it is moving at least 5 times an hour. explained this late in pregnancy there is less space for baby to move about.  discussed with Dr. Jennette Kettle. Advised pt to go to [facility] to be checked. she agreed & will go now. Follow-up by: Golden Circle RN,  December 19, 2010 2:54 PM

## 2011-01-11 NOTE — Assessment & Plan Note (Signed)
Summary: ob visit- 38.3 weeks   Vital Signs:  Patient profile:   20 year old female Weight:      143 pounds Pulse rate:   83 / minute BP sitting:   105 / 77  (left arm)  Vitals Entered By: Tessie Fass CMA (December 14, 2010 1:51 PM) CC: OB Visit   Primary Care Provider:  Ellin Mayhew MD  CC:  OB Visit.  History of Present Illness: pt states she has no complaints or concerns at today's appt.  Habits & Providers  Alcohol-Tobacco-Diet     Cigarette Packs/Day: n/a  Allergies: No Known Drug Allergies   Impression & Recommendations:  Problem # 1:  PREGNANCY, NORMAL (ICD-V22.2)  38.3 week OB visit: Pt is a G1P0- [facility] 12/25/10 based on  U/S.  Lab work:  A+, antibody-neg, rubella- immune, HIV- NR.  Group B strep +  BP wnl.  weight gain increased the past 2 weeks will continue to monitor.  Pt measurement 1cm less than expected- but trending up.  Will continue to monitor measurements.  group b strep +. still unsure about who anthony's pediatrician will be.   Boy- "Ethelene Browns no circumcision pain management- epidural feeding- unsure about breastfeeding- states that she will try to breastfeed at first Birth control- depo limited understanding about birth process-12/14/09- teaching provided on cervical dilitation, contractions, placenta.   at next appt: at next appt willl ask if she has decided on who will be anthony's doctor. continue to encourage breast feeding.  reinforce teaching.  Orders: Other OB visit- [facility] ([facility]BCK)  Complete Medication List: 1)  Prenatal/folic Acid Tabs (Prenatal vit-fe fumarate-fa) .... Take 1 tablet daily  Patient Instructions: 1)  return in 1 week for next appt.   Orders Added: 1)  Other OB visit- [facility] [[facility]K]     Flowsheet View for Follow-up Visit    Estimated weeks of       gestation:     38 3/7    Weight:     143    Blood pressure:   105 / 77    Hx headache?     No    Nausea/vomiting?   No    Edema?     0    Bleeding?     no  Leakage/discharge?   no    Fetal activity:       yes    Labor symptoms?   few ctx    Fundal height:      37    FHR:       145    Fetal position:      vertex    Taking Vitamins?   Y    Smoking PPD:   n/a    Next visit:     1 wk    Resident:     Edmonia James

## 2011-01-16 ENCOUNTER — Encounter: Payer: Self-pay | Admitting: *Deleted

## 2011-02-14 ENCOUNTER — Encounter: Payer: Self-pay | Admitting: Family Medicine

## 2011-02-14 ENCOUNTER — Ambulatory Visit (INDEPENDENT_AMBULATORY_CARE_PROVIDER_SITE_OTHER): Payer: Self-pay | Admitting: Family Medicine

## 2011-02-14 VITALS — BP 118/70 | Temp 99.0°F | Ht 62.75 in | Wt 120.0 lb

## 2011-02-14 DIAGNOSIS — Z309 Encounter for contraceptive management, unspecified: Secondary | ICD-10-CM

## 2011-02-14 DIAGNOSIS — IMO0001 Reserved for inherently not codable concepts without codable children: Secondary | ICD-10-CM | POA: Insufficient documentation

## 2011-02-14 MED ORDER — NORGESTIMATE-ETH ESTRADIOL 0.25-35 MG-MCG PO TABS: 1.0000 | ORAL_TABLET | Freq: Every day | ORAL | Status: DC

## 2011-02-14 NOTE — Assessment & Plan Note (Signed)
No red flags on today's exam.  Vagina healed well. No signs/symptoms of postpartum depression.  Reviewed ways to comfort crying infant.  Reviewed purple crying.  Pt to return if any signs or symptoms of postpartum depression.

## 2011-02-14 NOTE — Assessment & Plan Note (Signed)
Pt to start taking sprintec pills April 8th.  Gave pt info on mirena and implanon.  Pt to consider this and let me know if she would prefer one of these birth control methods.

## 2011-02-14 NOTE — Patient Instructions (Signed)
Take birth control pills as directed. Return in 2-3 months to recheck how you are doing on birth control tablets.

## 2011-02-14 NOTE — Progress Notes (Signed)
  Subjective:    Patient ID: Kathy Salazar, female    DOB: November 28, 1991, 20 y.o.   MRN: [id number]  HPI Pt here for 6 week postpartum visit:  -Pt states that she tried to have sex recently and had a lot of discomfort.  - no sign and symptoms of postpartum depression  -states that her infant cries frequently and is hard to comfort, admits she gets frustrated.  Reviewed infant comforting techniques and reviewed "purple crying"  -states she is having a lot of spotting with depo and would like to change to bcp   Review of Systems No fever. No cold symptoms. No nausea. No vomiting. +vaginal bleeding/spotting.       Objective:   Physical Exam  Constitutional: She is oriented to person, place, and time. She appears well-developed and well-nourished.  Cardiovascular: Normal rate.   Pulmonary/Chest: Effort normal and breath sounds normal. No respiratory distress.  Abdominal: Soft. Bowel sounds are normal. She exhibits no distension. There is no tenderness.  Genitourinary: Vagina normal and uterus normal.       Vaginal laceration now healed completely, minimal pain with insertion of speculum.  Os WNL.  Musculoskeletal: She exhibits no edema.  Neurological: She is alert and oriented to person, place, and time.  Skin: Skin is warm and dry.  Psychiatric: She has a normal mood and affect. Her behavior is normal. Judgment and thought content normal.          Assessment & Plan:

## 2011-02-20 LAB — URINALYSIS, ROUTINE W REFLEX MICROSCOPIC
Bilirubin Urine: NEGATIVE
Glucose, UA: NEGATIVE mg/dL
Hgb urine dipstick: NEGATIVE
Ketones, ur: NEGATIVE mg/dL
Nitrite: NEGATIVE
Protein, ur: NEGATIVE mg/dL
Specific Gravity, Urine: <1.005 — ABNORMAL LOW (ref 1.005–1.030)
Urobilinogen, UA: 0.2 mg/dL (ref 0.0–1.0)
pH: 6.5 (ref 5.0–8.0)

## 2011-02-20 LAB — URINE CULTURE
Colony Count: NO GROWTH
Culture  Setup Time: 201111050216
Culture: NO GROWTH

## 2011-02-20 LAB — URINE MICROSCOPIC-ADD ON

## 2011-02-22 LAB — GLUCOSE, CAPILLARY: Glucose-Capillary: 130 mg/dL — ABNORMAL HIGH (ref 70–99)

## 2011-03-26 ENCOUNTER — Telehealth: Payer: Self-pay | Admitting: Family Medicine

## 2011-03-26 NOTE — Telephone Encounter (Signed)
Went to pick up her BCP and the pharmacy doesn't have it anymore - she was supposed to start taking it on 4/8 Is not sure what to do at this time. Should she start the depo again?

## 2011-03-26 NOTE — Telephone Encounter (Signed)
Patient went to pick up her prescription and they said the electronic request never came through.  Patient's is near the end of her 12 weeks since her last depo and wanted to know should she get one more depo just to make sure she is covered.  Advised her to pick up BCPs today and begin taking them immediately bu tto also use protection for the next 2 weeks.  Pills were called in.

## 2011-05-03 ENCOUNTER — Ambulatory Visit (INDEPENDENT_AMBULATORY_CARE_PROVIDER_SITE_OTHER): Payer: Self-pay | Admitting: Family Medicine

## 2011-05-03 ENCOUNTER — Encounter: Payer: Self-pay | Admitting: Family Medicine

## 2011-05-03 VITALS — BP 110/64 | HR 72 | Temp 98.1°F | Ht 63.0 in | Wt 116.5 lb

## 2011-05-03 DIAGNOSIS — R110 Nausea: Secondary | ICD-10-CM | POA: Insufficient documentation

## 2011-05-03 NOTE — Assessment & Plan Note (Addendum)
May be due to bcp but only been present x 2 weeks. nausea is transient (only 10 min 2-3 x in past week).  May be side effect or just a transient symptom as pt is getting accustomed to this new medication.  Encouraged pt to continue to use bcp since she seems to like this form of contraception.  If the nausea continues after using bcp for 1-2 more months or pt  Has new or worsening symptoms pt is to return.  No red flag symptoms. Also pt may be having reflux type symptoms but since nausea only present x I am unsure if medication will be beneficial.  Told pt to use tums prn.  Encourage urine preg test.  Pt declined stating pregnancy not possible.

## 2011-05-03 NOTE — Progress Notes (Signed)
  Subjective:    Patient ID: Elleen Salazar, female    DOB: [birth year], 20 y.o.   MRN: [id number]  HPI Nausea: Nausea 3-4 x per week, lasts approx , varies at times, some at night sometimes in am.  Has been going on x 2 weeks.  Hasn't tried to take anything - goes away after 10 min. No vomiting.  Acidic foods seem to make it worse.  No fever.  No epigastric pain.  No dark or bloody stools. No breast tenderness.  Has been taking bcp x 2 months.  She is concerned that the bcp is causing her to have nausea. Pt states that there is no way she can be pregnant.  Offered urine pregnancy test and declined.      Review of Systems As per above    Objective:   Physical Exam  Constitutional: She is oriented to person, place, and time. She appears well-developed and well-nourished.  Cardiovascular: Normal rate.   Pulmonary/Chest: Effort normal and breath sounds normal. No respiratory distress.  Abdominal: Soft. She exhibits no distension. There is no tenderness.  Musculoskeletal: Normal range of motion.  Neurological: She is alert and oriented to person, place, and time.          Assessment & Plan:

## 2011-05-03 NOTE — Patient Instructions (Signed)
It could be your birth control pill- but you are just starting it so give it 2 more months and return if not improved or if any new or worsening symptoms.  It could be due to acid reflux- so you can take tums to see if this helps.

## 2011-07-25 ENCOUNTER — Telehealth: Payer: Self-pay | Admitting: Family Medicine

## 2011-07-25 NOTE — Telephone Encounter (Signed)
Forward to PCP.

## 2011-07-25 NOTE — Telephone Encounter (Signed)
Pt is requesting a recommendation letter from her doctor for her lawyer. Call pt with questions.

## 2011-07-27 NOTE — Telephone Encounter (Signed)
Attempt to call pt back.  Unable to get through on given number.  Only busy signal.  If pt calls back please get an accurate phone number.  Also ask for more details of the letter that she needs or simply have her lawyer send me a letter requesting needed paperwork.  If it is a form please have her submit per normal clinic protocol.

## 2011-08-29 ENCOUNTER — Telehealth (HOSPITAL_COMMUNITY): Payer: Self-pay | Admitting: Family Medicine

## 2011-08-29 NOTE — Telephone Encounter (Signed)
Pt requested a letter from  her pcp, and it has to explain  Dr. Edmonia James been her doctor since pt  was pregnant to now a day. This letter will help pt  for change her migratory status. Pt attorney requested this letter to pt.    [place]

## 2011-08-30 NOTE — Telephone Encounter (Signed)
Does pt have a release of information form in her chart for me to release information to this lawyer? Also what is the name of the lawyer,fax #,address, and what information do I need to put in the letter?  It might be best just to call pt and have her lawyer send me a letter requesting the information so we can make sure that the letter has all of the needed information.  Thanks, [employer]

## 2012-04-10 ENCOUNTER — Encounter: Payer: Self-pay | Admitting: Family Medicine

## 2012-07-02 ENCOUNTER — Encounter: Payer: Self-pay | Admitting: Family Medicine

## 2012-07-02 ENCOUNTER — Ambulatory Visit (INDEPENDENT_AMBULATORY_CARE_PROVIDER_SITE_OTHER): Payer: Self-pay | Admitting: Family Medicine

## 2012-07-02 VITALS — BP 113/71 | HR 93 | Temp 97.7°F | Ht 63.0 in | Wt 119.5 lb

## 2012-07-02 DIAGNOSIS — N912 Amenorrhea, unspecified: Secondary | ICD-10-CM

## 2012-07-02 NOTE — Assessment & Plan Note (Addendum)
A: exam and history consistent with pregnancy. Based on LMP [redacted]w[redacted]d today with [facility] of 01/07/13.  P:  quant HCG given spotting to confirm pregnancy is viable.  Write pregnancy verification letter once lab back if positive.  Patient to call adopt a mom for insurance. To call use once approved by adopt a mom for prenatal labs and to schedule new OB visit.

## 2012-07-02 NOTE — Progress Notes (Signed)
Subjective:     Patient ID: Kathy Salazar, female   DOB: 28-Jan-1991, 21 y.o.   MRN: [id number]  HPI 21 yo F presents with a complaint of amenorrhea. Certain LMP: 04/02/12. Positive home pregnancy test. Has had intermittent spotting ranging from bright red to brown to pink. Today spotting is pink. When it was bright red 2 days ago it was associated with abdominal pain. No abdominal pain today. Reports slight nausea, no vomiting, breast soreness, weight gain.  Denies ETOH (had 1 drink in past 3 months), smoking and IDU.  OB History    Grav Para Term Preterm Abortions TAB SAB Ect Mult Living   1              Soc Hx: live with husband and 47 mos old son.   Meds: taking PNV.   Review of Systems As per HPI  Objective:   Physical Exam BP 113/71  Pulse 93  Temp 97.7 F (36.5 C) (Oral)  Ht 5\' 3"  (1.6 m)  Wt 119 lb 8 oz (54.205 kg)  BMI 21.17 kg/m2  LMP 04/02/2012  Breastfeeding? Unknown General appearance: alert, cooperative and no distress Abdomen: soft, non-tender; bowel sounds normal; no masses,  no organomegaly Pelvic: external genitalia normal, no adnexal masses or tenderness, no cervical motion tenderness and cervix with ectropion of pregnancy, cervical mucus and bright red blood noted at cervical os, no leakage or pooling of fluid or blood. Digital cervical exam FT-1/thick/long.  U preg: unable to obtain. Patient has no urine to give. Assessment and Plan:

## 2012-07-02 NOTE — Patient Instructions (Addendum)
Kathy Salazar,  Thank you for coming in today. We will get a blood pregnancy test today. Once it comes back positive, we will write letter to adopt-[employer]-mom for pregnancy verification.  Continue the prenatal vitamin, continue to not drink, smoker or use drugs.  Heat up all cold deli meet and cheese before eating. Limit fish to once per week.   For spotting: come back is more than a pantiliner or spotting becomes painful/heavy flow. You can also go to [facility].   Plan to f/u soon for new OB labs.   Dr. Armen Pickup

## 2012-07-03 ENCOUNTER — Encounter: Payer: Self-pay | Admitting: Family Medicine

## 2012-07-03 LAB — HCG, QUANTITATIVE, PREGNANCY: hCG, Beta Chain, Quant, S: 12904.2 m[IU]/mL

## 2012-07-05 ENCOUNTER — Encounter (HOSPITAL_COMMUNITY): Payer: Self-pay

## 2012-07-05 ENCOUNTER — Inpatient Hospital Stay (HOSPITAL_COMMUNITY)
Admission: AD | Admit: 2012-07-05 | Discharge: 2012-07-05 | Disposition: A | Discharge status: Home / Self Care | Payer: Self-pay | Source: Ambulatory Visit | Attending: Obstetrics and Gynecology | Admitting: Obstetrics and Gynecology

## 2012-07-05 ENCOUNTER — Inpatient Hospital Stay (HOSPITAL_COMMUNITY): Payer: Self-pay

## 2012-07-05 DIAGNOSIS — O021 Missed abortion: Secondary | ICD-10-CM | POA: Insufficient documentation

## 2012-07-05 HISTORY — DX: Other specified health status: Z78.9

## 2012-07-05 LAB — WET PREP, GENITAL
Clue Cells Wet Prep HPF POC: NONE SEEN
Trich, Wet Prep: NONE SEEN
Yeast Wet Prep HPF POC: NONE SEEN

## 2012-07-05 LAB — URINALYSIS, ROUTINE W REFLEX MICROSCOPIC
Bilirubin Urine: NEGATIVE
Glucose, UA: NEGATIVE mg/dL
Ketones, ur: NEGATIVE mg/dL
Nitrite: NEGATIVE
Protein, ur: NEGATIVE mg/dL
Specific Gravity, Urine: <1.005 — ABNORMAL LOW (ref 1.005–1.030)
Urobilinogen, UA: 0.2 mg/dL (ref 0.0–1.0)
pH: 6 (ref 5.0–8.0)

## 2012-07-05 LAB — HCG, QUANTITATIVE, PREGNANCY: hCG, Beta Chain, Quant, S: 7861 m[IU]/mL — ABNORMAL HIGH (ref <5)

## 2012-07-05 LAB — URINE MICROSCOPIC-ADD ON

## 2012-07-05 NOTE — MAU Note (Signed)
Vaginal bleeding since 7/24. Now heavier and with many small clots, bright red. Cervix was checked on 7/24 at [facility] and was told 1cm open. Lower back cramping started today.

## 2012-07-05 NOTE — MAU Provider Note (Signed)
History     CSN: [id number]  Arrival date and time: 07/05/12 2036   First Provider Initiated Contact with Patient 07/05/12 2123      Chief Complaint  Patient presents with  . Vaginal Bleeding  . Back Pain   HPI Kathy Salazar is a 21 y.o. female @ [redacted]w[redacted]d gestation who presents to MAU for vaginal bleeding. She is getting care at [facility]. She had her prenatal visit and they told her her cervix was 1 cm. 07/03/12: Bhcg was 12,900. Increased bleeding today with clots. The history was obtained from the patient and her medical record.   OB History    Grav Para Term Preterm Abortions TAB SAB Ect Mult Living   2 1 1  0 0 0 0 0 0 1      Past Medical History  Diagnosis Date  . No pertinent past medical history     Past Surgical History  Procedure Date  . Appendectomy     Family History  Problem Relation Age of Onset  . Miscarriages / Stillbirths Sister     History  Substance Use Topics  . Smoking status: Never Smoker   . Smokeless tobacco: Not on file  . Alcohol Use: No    Allergies: No Known Allergies  Prescriptions prior to admission  Medication Sig Dispense Refill  . Prenatal Vit-Fe Fumarate-FA (PRENATAL MULTIVITAMIN) TABS Take 1 tablet by mouth every morning.        Review of Systems  Constitutional: Negative for fever, chills and weight loss.  HENT: Negative for ear pain, nosebleeds, congestion, sore throat and neck pain.   Eyes: Negative for blurred vision, double vision, photophobia and pain.  Respiratory: Negative for cough, shortness of breath and wheezing.   Cardiovascular: Negative for chest pain, palpitations and leg swelling.  Gastrointestinal: Positive for abdominal pain. Negative for heartburn, nausea, vomiting, diarrhea and constipation.  Genitourinary: Positive for frequency. Negative for dysuria and urgency.       Vaginal bleeding with clots.  Musculoskeletal: Positive for back pain. Negative for myalgias.  Skin: Negative for itching and rash.    Neurological: Negative for dizziness, sensory change, speech change, seizures, weakness and headaches.  Endo/Heme/Allergies: Does not bruise/bleed easily.  Psychiatric/Behavioral: Negative for depression. The patient is not nervous/anxious.    Physical Exam   Blood pressure 125/75, pulse 87, temperature 98.6 F (37 C), temperature source Oral, resp. rate 16, height 5\' 3"  (1.6 m), weight 120 lb 3.2 oz (54.522 kg), last menstrual period 04/02/2012, unknown if currently breastfeeding.  Physical Exam  Nursing note and vitals reviewed. Constitutional: She is oriented to person, place, and time. She appears well-developed and well-nourished. No distress.  HENT:  Head: Normocephalic and atraumatic.  Eyes: EOM are normal.  Neck: Neck supple.  Cardiovascular: Normal rate.   Respiratory: Effort normal.  GI: Soft. There is no tenderness.  Genitourinary: Vagina normal.       External genitalia without lesions. Small blood vaginal vault. Cervix external os, fingertip. No CMT, no adnexal tenderness. Uterus slightly enlarged.  Musculoskeletal: Normal range of motion.  Neurological: She is alert and oriented to person, place, and time.  Skin: Skin is warm and dry.  Psychiatric: She has a normal mood and affect. Her behavior is normal. Judgment and thought content normal.   Results for orders placed during the hospital encounter of 07/05/12 (from the past 24 hour(s))  URINALYSIS, ROUTINE W REFLEX MICROSCOPIC     Status: Abnormal   Collection Time   07/05/12  8:44 PM  Component Value Range   Color, Urine YELLOW  YELLOW   APPearance CLEAR  CLEAR   Specific Gravity, Urine <1.005 (*) 1.005 - 1.030   pH 6.0  5.0 - 8.0   Glucose, UA NEGATIVE  NEGATIVE mg/dL   Hgb urine dipstick LARGE (*) NEGATIVE   Bilirubin Urine NEGATIVE  NEGATIVE   Ketones, ur NEGATIVE  NEGATIVE mg/dL   Protein, ur NEGATIVE  NEGATIVE mg/dL   Urobilinogen, UA 0.2  0.0 - 1.0 mg/dL   Nitrite NEGATIVE  NEGATIVE   Leukocytes,  UA TRACE (*) NEGATIVE  URINE MICROSCOPIC-ADD ON     Status: Abnormal   Collection Time   07/05/12  8:44 PM      Component Value Range   Squamous Epithelial / LPF FEW (*) RARE   WBC, UA 3-6  <3 WBC/hpf   RBC / HPF 11-20  <3 RBC/hpf   Bacteria, UA FEW (*) RARE  WET PREP, GENITAL     Status: Abnormal   Collection Time   07/05/12  9:45 PM      Component Value Range   Yeast Wet Prep HPF POC NONE SEEN  NONE SEEN   Trich, Wet Prep NONE SEEN  NONE SEEN   Clue Cells Wet Prep HPF POC NONE SEEN  NONE SEEN   WBC, Wet Prep HPF POC FEW (*) NONE SEEN   Ultrasound: IUFD vs blighted Ovum  Clinical Data: Vaginal bleeding. Pregnant.OBSTETRIC;14 WK [place] AND TRANSVAGINAL OB USTechnique: Both transabdominal and transvaginal ultrasound examinations were performed for complete evaluation of thegestation as well as the maternal uterus, adnexal regions, and pelvic cul-de-sac. Transvaginal technique was performed to assess early pregnancy. Comparison: None. Intrauterine gestational sac: Round gestational sac is identified in the fundus Yolk sac: Not identified Embryo: See below. Cardiac Activity: None.Heart Rate: Not applicable bpmMSD: 34.1 mm eight w six d EDC: 03/02/2014Maternal uterus/adnexae:mall subchorionic hemorrhages present. There is no yolk sac.Only a small soft tissue density is present within the gestational sac much smaller than one would expect for the appropriately sizedfetus. Length is 5.8 mm. Left ovary corpus luteum cyst. Right<BR>ovary unremarkable. No free fluid. No adnexal mass.IMPRESSION:There is a large gestational sac with only a 6 mm soft tissue density and no yolk sac and no cardiac activity. Differentialdiagnosis includes subacute fetal demise with involution of the fetus or blighted ovum.    MAU Course  Procedures  Assessment and Plan  21 y.o. female with Failed pregnancy  Plan: Discussed failed pregnancy and options and follow up with Gyn Clinic to discuss.  Return here as needed. Will  draw repeat quant today for comparison, call pt with results tomorrow. Expectant management at present. Bleeding precautions reviewed.  NEESE,HOPE, RN, FNP, [facility] 07/05/2012, 9:23 PM   Calhoun Reichardt E. 11:27 PM

## 2012-07-06 NOTE — MAU Provider Note (Signed)
Agree with above note.  Baneza Bartoszek 07/06/2012 7:43 AM   

## 2012-07-08 ENCOUNTER — Inpatient Hospital Stay (HOSPITAL_COMMUNITY): Payer: Self-pay

## 2012-07-08 ENCOUNTER — Telehealth: Payer: Self-pay | Admitting: *Deleted

## 2012-07-08 ENCOUNTER — Ambulatory Visit (HOSPITAL_COMMUNITY): Admission: RE | Admit: 2012-07-08 | Payer: Self-pay | Source: Ambulatory Visit

## 2012-07-08 ENCOUNTER — Encounter (HOSPITAL_COMMUNITY): Payer: Self-pay | Admitting: *Deleted

## 2012-07-08 ENCOUNTER — Inpatient Hospital Stay (HOSPITAL_COMMUNITY)
Admission: AD | Admit: 2012-07-08 | Discharge: 2012-07-08 | Disposition: A | Discharge status: Home / Self Care | Payer: Self-pay | Source: Ambulatory Visit | Attending: Obstetrics & Gynecology | Admitting: Obstetrics & Gynecology

## 2012-07-08 DIAGNOSIS — O039 Complete or unspecified spontaneous abortion without complication: Secondary | ICD-10-CM | POA: Insufficient documentation

## 2012-07-08 DIAGNOSIS — O209 Hemorrhage in early pregnancy, unspecified: Secondary | ICD-10-CM

## 2012-07-08 LAB — CBC
HCT: 36.3 % (ref 36.0–46.0)
Hemoglobin: 12.3 g/dL (ref 12.0–15.0)
MCH: 29.4 pg (ref 26.0–34.0)
MCHC: 33.9 g/dL (ref 30.0–36.0)
MCV: 86.6 fL (ref 78.0–100.0)
Platelets: 159 10*3/uL (ref 150–400)
RBC: 4.19 MIL/uL (ref 3.87–5.11)
RDW: 12.9 % (ref 11.5–15.5)
WBC: 6.4 10*3/uL (ref 4.0–10.5)

## 2012-07-08 LAB — HCG, QUANTITATIVE, PREGNANCY: hCG, Beta Chain, Quant, S: 4087 m[IU]/mL — ABNORMAL HIGH (ref <5)

## 2012-07-08 LAB — GC/CHLAMYDIA PROBE AMP, GENITAL
Chlamydia, DNA Probe: NEGATIVE
GC Probe Amp, Genital: NEGATIVE

## 2012-07-08 MED ORDER — LACTATED RINGERS IV BOLUS (SEPSIS)
1000.0000 mL | Freq: Once | INTRAVENOUS | Status: AC
  Administered 2012-07-08: 1000 mL via INTRAVENOUS

## 2012-07-08 MED ORDER — DOXYCYCLINE HYCLATE 100 MG PO TABS: 100.0000 mg | ORAL_TABLET | Freq: Two times a day (BID) | ORAL | Status: DC

## 2012-07-08 MED ORDER — IBUPROFEN 800 MG PO TABS: 800.0000 mg | ORAL_TABLET | Freq: Three times a day (TID) | ORAL | Status: AC

## 2012-07-08 MED ORDER — DOXYCYCLINE HYCLATE 100 MG PO TABS
100.0000 mg | ORAL_TABLET | Freq: Once | ORAL | Status: AC
  Administered 2012-07-08: 100 mg via ORAL
  Filled 2012-07-08: qty 1

## 2012-07-08 MED ORDER — DOXYCYCLINE HYCLATE 100 MG PO CAPS: 100.0000 mg | ORAL_CAPSULE | Freq: Two times a day (BID) | ORAL | Status: DC

## 2012-07-08 MED ORDER — KETOROLAC TROMETHAMINE 60 MG/2ML IM SOLN: 60.0000 mg | Freq: Once | INTRAMUSCULAR | Status: DC

## 2012-07-08 MED ORDER — DOXYCYCLINE HYCLATE 100 MG PO CAPS: 100.0000 mg | ORAL_CAPSULE | Freq: Two times a day (BID) | ORAL | Status: AC

## 2012-07-08 MED ORDER — FENTANYL CITRATE 0.05 MG/ML IJ SOLN: 50.0000 ug | Freq: Once | INTRAMUSCULAR | Status: DC

## 2012-07-08 MED ORDER — MISOPROSTOL 200 MCG PO TABS
800.0000 ug | ORAL_TABLET | Freq: Once | ORAL | Status: AC
  Administered 2012-07-08: 800 ug via RECTAL
  Filled 2012-07-08: qty 2

## 2012-07-08 MED ORDER — MISOPROSTOL 200 MCG PO TABS
ORAL_TABLET | ORAL | Status: AC
  Filled 2012-07-08: qty 4

## 2012-07-08 MED ORDER — MISOPROSTOL 200 MCG PO TABS: ORAL_TABLET | ORAL | Status: DC

## 2012-07-08 MED ORDER — FENTANYL CITRATE 0.05 MG/ML IJ SOLN
INTRAMUSCULAR | Status: AC
  Filled 2012-07-08: qty 2

## 2012-07-08 MED ORDER — SODIUM CHLORIDE 0.9 % IJ SOLN
INTRAMUSCULAR | Status: AC
  Filled 2012-07-08: qty 6

## 2012-07-08 NOTE — MAU Note (Signed)
Brought in by EMS; miscarriage in progress;

## 2012-07-08 NOTE — Telephone Encounter (Signed)
Patient calls stating she is bleeding heavy. Has gone thru a pad in 10 minutes. States blood is getting all over her clothes. States she is cramping . Advised her to call 911 if she is bleeding excessively. States she is actually on the way to the ED . Advised to go to [facility] ED.

## 2012-07-08 NOTE — MAU Note (Signed)
Brought in by EMS; seen at [facility] last wed and then [facility] Sat; brought in with increased bleeding and increased pain- passing tissue (POC); POC removed with ring forcep by NP; Cytotec given per rectum and Fentanyl given IM; 100 ml of fluid received IV; pain is decreased to 5 presently; very small amount of bleeding noted at present; comfort pamplets and comfor pillow given to pt;

## 2012-07-09 ENCOUNTER — Encounter: Payer: Self-pay | Admitting: Family

## 2012-07-09 NOTE — MAU Provider Note (Signed)
  History     CSN: [id number]  Arrival date and time: 07/08/12 1558   None     Chief Complaint  Patient presents with  . Vaginal Bleeding   HPI Pt presents to MAU via EMS with known failed pregnancy at [redacted]w[redacted]d with f/u clinic appointment in 2 weeks.  Pt has been having vaginal spotting and bleeding with increase @ 2pm , passing clots and saturating a pad every 10 minutes.  Pt is having intense abominal cramping.  Past Medical History  Diagnosis Date  . No pertinent past medical history     Past Surgical History  Procedure Date  . Appendectomy     Family History  Problem Relation Age of Onset  . Miscarriages / Stillbirths Sister     History  Substance Use Topics  . Smoking status: Never Smoker   . Smokeless tobacco: Not on file  . Alcohol Use: No    Allergies: No Known Allergies  No prescriptions prior to admission    ROS Physical Exam   Blood pressure 118/70, pulse 94, temperature 98.5 F (36.9 C), temperature source Oral, resp. rate 18, height 5\' 3"  (1.6 m), weight 120 lb (54.432 kg), last menstrual period 04/02/2012, SpO2 100.00%.  Physical Exam  Nursing note and vitals reviewed. Constitutional: She is oriented to person, place, and time. She appears well-developed and well-nourished. She appears distressed.  HENT:  Head: Normocephalic.  Eyes: Pupils are equal, round, and reactive to light.  Neck: Normal range of motion. Neck supple.  Cardiovascular: Normal rate.   Respiratory: Effort normal.  GI: Soft. There is tenderness. There is no rebound.  Genitourinary:       Speculum inserted with large clots and profuse amount of bleeding; softball size sac removed that appeared intact with some trailing membranes- however bleeding not arrested but continued- unable to visualize cervix due to bleeding.    Musculoskeletal: Normal range of motion.  Neurological: She is alert and oriented to person, place, and time.  Skin: Skin is warm and dry.  Psychiatric: She  has a normal mood and affect.    MAU Course  Procedures IV with LR bolus infusion Cytotec per rectum given by Candelaria Celeste, MD- bleeding began slowing down Fentanyl IV to help with pain. approx blood loss 800cc-100cc Dr. Erin Fulling here to assist (additional note to be written by Dr. Erin Fulling) Pt's bleeding subsided and pt felt better- vital signs stable Pt discharged home to repeat Cytotec per rectum in 8 hours Pt was started on Doxycycline 100 mg in MAU to continue BID for 5 days Pt to continue Prenatal Viatmins Prescription for Motrin 800 mg q 8 hours for pain Pt to f/u in GYN clinic in 1 week  Assessment and Plan  SAB with hemorrhage Cytotec in MAU to be repeated in 8 hours F/u in 1 week in clinic- sooner if increase in pain, bleeding or fever  Sher Shampine 07/09/2012, 7:41 PM  Telephone call to pt to see how patient doing- pt feeling better- bleeding like a period at this point; continues to have cramping eased with Motrin prescription. Pt will call clinic to verify appointment and telephone number.  Pt to return sooner if increase in pain or bleeding.

## 2012-07-10 NOTE — MAU Provider Note (Signed)
Pt seen in MAU.  Cervix previously evacuated with forceps.  Using Ringed forceps, I further evacuated the products of conception and large clots.  The uterus appeared to be empty.  This was further confirmed with sono which I observed firsthand.  Pt was given cytotec per rectum prior to procedure and also  Was given a Rx to place another dose in 8hrs.  Pt was treated with Doxycycline 100mg  bid x 5day.  Pt to f/u for contraception counseling in 1-2 weeks.  Cleave Ternes L. Harraway-Smith, M.D., [facility]

## 2012-07-18 ENCOUNTER — Other Ambulatory Visit: Payer: Self-pay

## 2012-07-25 ENCOUNTER — Ambulatory Visit: Payer: Self-pay | Admitting: Family Medicine

## 2012-07-25 ENCOUNTER — Encounter: Payer: Self-pay | Admitting: Family Medicine

## 2012-07-25 ENCOUNTER — Ambulatory Visit (INDEPENDENT_AMBULATORY_CARE_PROVIDER_SITE_OTHER): Payer: Self-pay | Admitting: Family Medicine

## 2012-07-25 VITALS — BP 112/75 | HR 72 | Ht 63.0 in | Wt 117.9 lb

## 2012-07-25 DIAGNOSIS — Z309 Encounter for contraceptive management, unspecified: Secondary | ICD-10-CM

## 2012-07-25 DIAGNOSIS — IMO0001 Reserved for inherently not codable concepts without codable children: Secondary | ICD-10-CM

## 2012-07-25 DIAGNOSIS — O039 Complete or unspecified spontaneous abortion without complication: Secondary | ICD-10-CM | POA: Insufficient documentation

## 2012-07-25 LAB — POCT URINE PREGNANCY: Preg Test, Ur: POSITIVE

## 2012-07-25 LAB — HCG, QUANTITATIVE, PREGNANCY: hCG, Beta Chain, Quant, S: 11.7 m[IU]/mL

## 2012-07-25 MED ORDER — NORGESTIMATE-ETH ESTRADIOL 0.25-35 MG-MCG PO TABS: 1.0000 | ORAL_TABLET | Freq: Every day | ORAL | Status: DC

## 2012-07-25 NOTE — Assessment & Plan Note (Addendum)
Vaginal bleeding is resolving.  Patient not interested in pelvic exam today.  Urine pregnancy was positive likely due to miscarriage.  Will order serum HCG for confirmation. Advised patient to return to clinic if she develops worsening bleeding, pelvic cramps, vaginal discharge or associated fevers, nausea or vomiting.  No birth control pills until serum HCG negative.  Condoms in the meantime. Will call in Sprintec if HCG negative. Patient to apply for [facility] - she is interested in switching to DEPO in the future.

## 2012-07-25 NOTE — Patient Instructions (Addendum)
It was nice to meet you today.  I am sorry to hear about your loss. You can start taking birth control pills today and take daily at the same time. If you develop heavy vaginal bleeding, discharge, worsening pelvic cramping, nausea/vomiting or fevers, please return to clinic or report to MAU.  Miscarriage (Spontaneous Miscarriage) A miscarriage is when you lose your baby before the twentieth week of pregnancy. Miscarriages happen in 15-20% of pregnancies. Most miscarriages happen in the first 13 weeks of the pregnancy. In medical terms, this is called a spontaneous miscarriage or early pregnancy loss. No further treatment is needed when the miscarriage is complete and all products of conception have been passed out of the body. You can begin trying for another pregnancy as soon as your caregiver says it is okay. CAUSES   Most causes are not known.   Genetic problems like abnormal, not enough or too many chromosomes.   Infection of the cervix or uterus.   An abnormal shaped uterus, fibroid tumors or congenital abnormalities.   Hormone problems.   Medical problems.   Incompetent cervix, the tissue in the cervix is not strong enough to hold the pregnancy.   Smoking, too much alcohol use and illegal drugs.   Trauma.  SYMPTOMS   Bleeding or spotting from the vagina.   Cramping of the lower abdomen.   Passing of fluid from the vagina with or without cramps or pain.   Passing fetal tissue.  TREATMENT   Sometimes no further treatment is necessary if you pass all the tissue in the uterus.   If partial parts of the fetus or placenta remain in the body (incomplete miscarriage), tissue left behind may become infected. Usually a D and C (Dilatation and Curettage) suction or scrapping of the uterus is necessary to remove the remaining tissue in uterus. The procedure is only done when your caregiver knows that there is no chance for the pregnancy to continue. This is determined by a physical  exam, a negative pregnancy test, blood tests and perhaps an ultrasound revealing a dead fetus or no fetus developing because a problem occurred at conception (when the sperm and egg unite).   Medications may be necessary, antibiotics if there is an infection or medications to contract the uterus if there is a lot of bleeding.   If you have Rh negative blood and your partner is Rh positive, you will need a Rho-gam shot (an immune globulin vaccine). This will protect your baby from having Rh blood problems in future pregnancies.  HOME CARE INSTRUCTIONS   Your caregiver may order bed rest (up to the bathroom only). He or she may allow you to continue light activity. You may need to make arrangements for the care of children and for any other responsibilities.   Keep track of the number of pads you use each day and how soaked (saturated) they are. Record this information.   Do not use tampons. Do not douche or have sexual intercourse until approved by your caregiver.   Only take over-the-counter or prescription medicines for pain, discomfort or fever as directed by your caregiver.   Do not take aspirin because it can cause bleeding.   It is very important to keep all follow-up appointments for re-evaluations and continuing management.   Tell your caregiver if you are experiencing domestic violence.   Women who have an Rh negative blood type (i.e., A, B, AB, or O negative) need to receive a drug called Rh(D) immune globulin (RhoGam).  This medicine helps protect future fetuses against problems that can occur if an Rh negative mother is carrying a baby who is Rh positive.   If you and/or your partner are having problems with guilt or grieving, talk to your caregiver or seek counseling to help you cope with the pregnancy loss. Allow enough time to grieve before trying to get pregnant again.  SEEK IMMEDIATE MEDICAL CARE IF:   You have severe cramps or pain in your stomach, back, or belly (abdomen).    You have a fever.   You pass large clots or tissue. Save any tissue for your caregiver to inspect.   Your bleeding increases.   You become light-headed, weak or have fainting episodes.   You develop chills.  Document Released: 05/22/2001 Document Revised: 11/15/2011 Document Reviewed: 06/28/2008 [facility] Patient Information 2012 [place], [place].

## 2012-07-25 NOTE — Progress Notes (Signed)
  Subjective:    Patient ID: Kathy Salazar, female    DOB: 1991/05/06, 21 y.o.   MRN: [id number]  HPI  Patient presents to clinic for follow up miscarriage at 13 weeks and 3 days.  Occurred to 3 weeks ago.  I reviewed patient's chart.  Vaginal bleeding was heavy 2 weeks ago, but has become lighter. Now she describes bleeding as a dark purple discharge. She wears a panty liner daily. She denies any vaginal discharge. Denies any pelvic cramping, fevers, chills, nausea or vomiting.  Patient has been coping with a miscarriage well. She has a 5-year-old child who keeps her busy. She is not interested in speaking to a counselor at this time.  Patient is here to discuss birth control options. Because patient no longer has insurance, we discussed birth control pills being the least expensive option for her.  Patient has been on birth control pills in the past.  Patient would like to switch to Depo shots if she is able to get orange card.    Review of Systems  Per history of present illness    Objective:   Physical Exam  General: Pleasant, in no acute distress GI: Abdomen is soft, non-tender, non-distended; no masses or organomegaly. Pelvic exam deferred due to patient's request      Assessment & Plan:   See problem list

## 2012-07-30 ENCOUNTER — Telehealth: Payer: Self-pay | Admitting: *Deleted

## 2012-07-30 ENCOUNTER — Other Ambulatory Visit: Payer: Self-pay | Admitting: Family Medicine

## 2012-07-30 MED ORDER — NORGESTIMATE-ETH ESTRADIOL 0.25-35 MG-MCG PO TABS: 1.0000 | ORAL_TABLET | Freq: Every day | ORAL | Status: DC

## 2012-07-30 NOTE — Telephone Encounter (Signed)
Called and informed patient of Rx sent to pharmacy.Kathy Salazar  

## 2012-07-30 NOTE — Telephone Encounter (Signed)
Message copied by Jennette Bill on Wed Jul 30, 2012  2:55 PM ------      Message from: [facility] [place], [place] D      Created: Wed Jul 30, 2012  2:30 PM      Regarding: RE: birth control       Have sent it.      ----- Message -----         From: Barnabas Lister, MD         Sent: 07/30/2012  11:58 AM           To: Brent Bulla, MD, [facility]      Subject: birth control                                            Hi Noralee Stain - we saw this patient together last Friday.  Her serum HCG is low at 11.7.  I am out of town, but her PCP is Dr. Louanne Belton I believe.  Will you please ask him to re-send her birth control pills to her pharmacy?              Thanks,      [employer]

## 2013-07-11 IMAGING — US US OB TRANSVAGINAL
1 series · 14 of 28 positions shown · non-contrast
Comparison: None.

CLINICAL DATA: Vaginal bleeding.  Pregnant.

OBSTETRIC <14 WK US AND TRANSVAGINAL OB US
TECHNIQUE: Both transabdominal and transvaginal ultrasound
examinations were performed for complete evaluation of the
gestation as well as the maternal uterus, adnexal regions, and
pelvic cul-de-sac.  Transvaginal technique was performed to assess
early pregnancy.

[Series 1: us ob comp less 14 wks · 38 acquisitions, 14 frames shown]
[im 2/38]
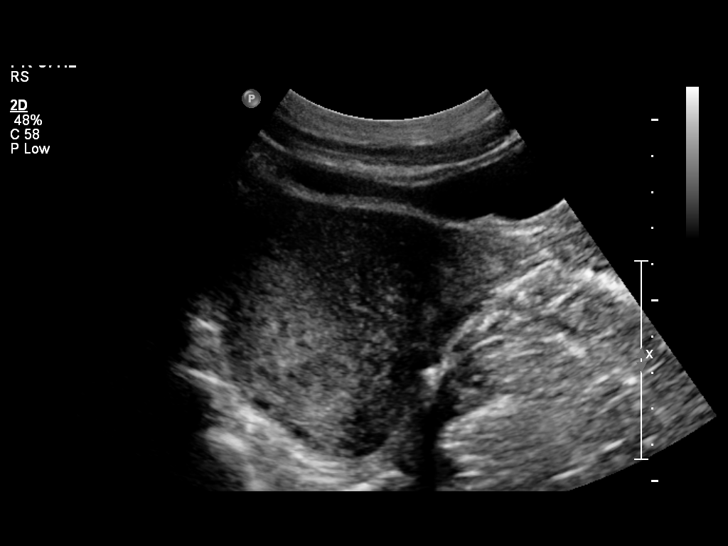
[im 5/38]
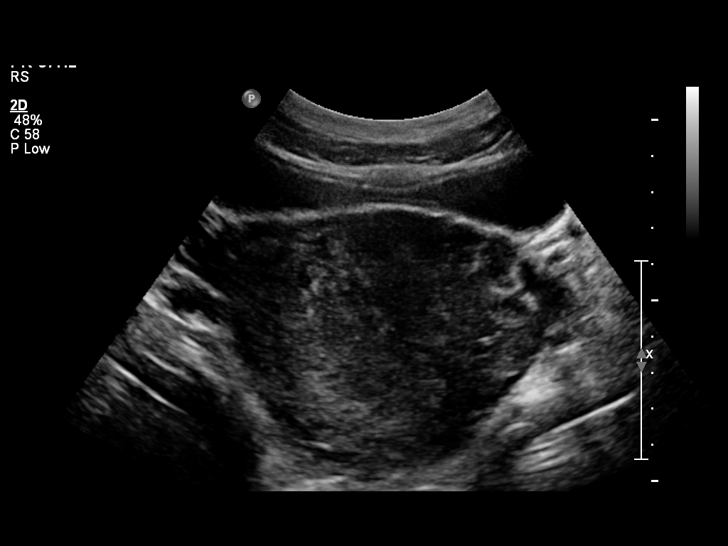
[im 7/38]
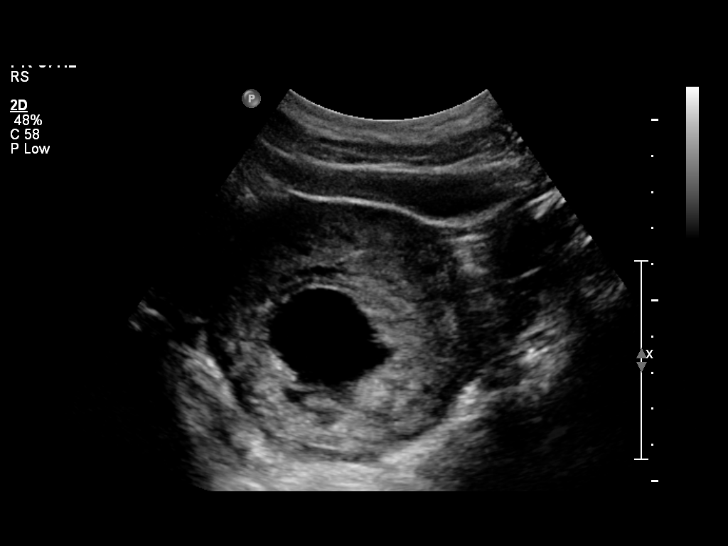
[im 10/38]
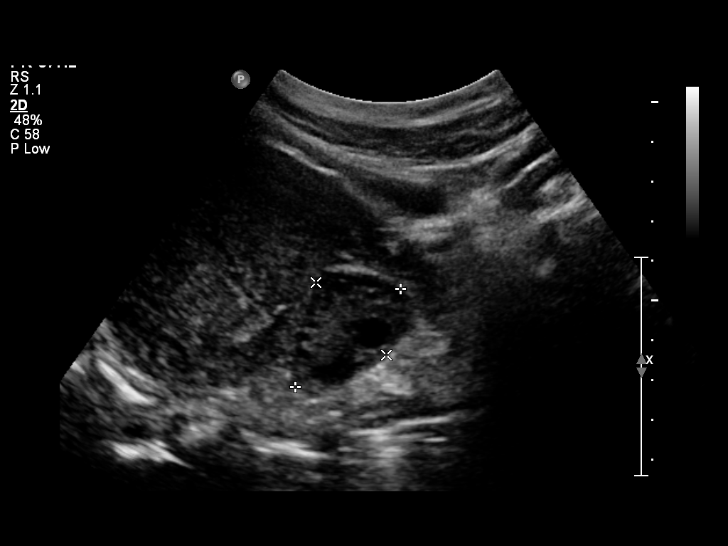
[im 13/38]
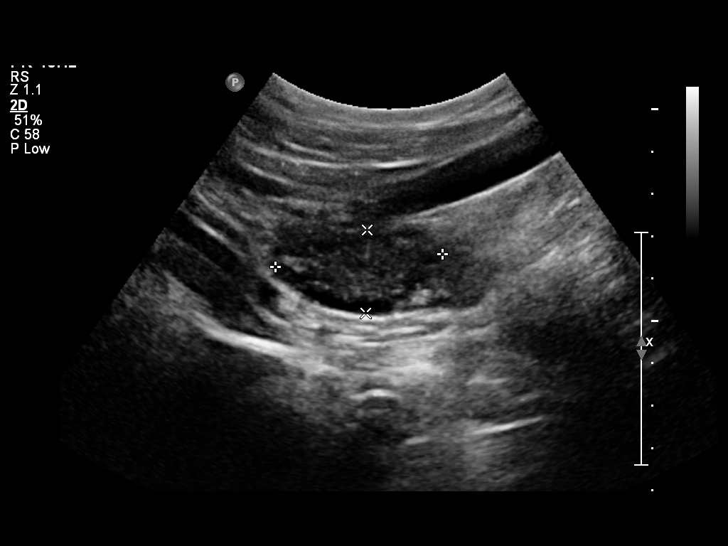
[im 16/38]
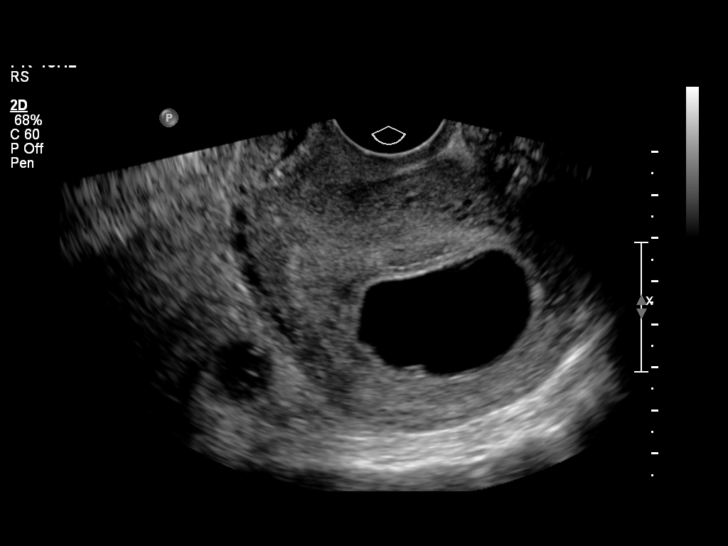
[im 18/38]
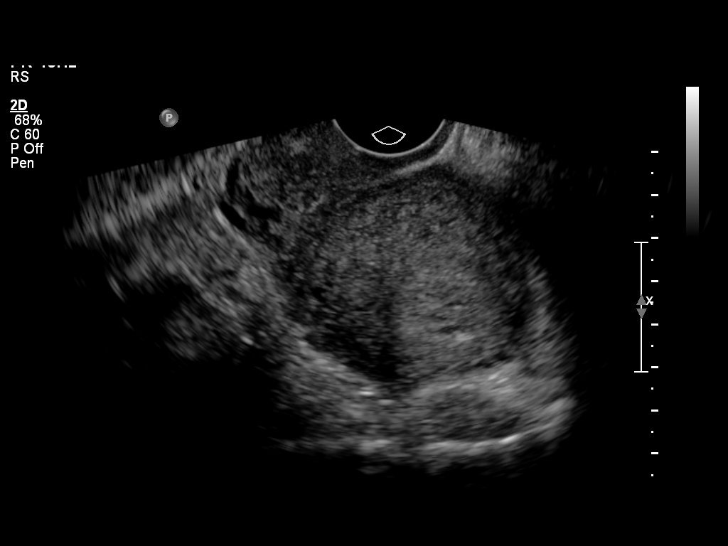
[im 21/38]
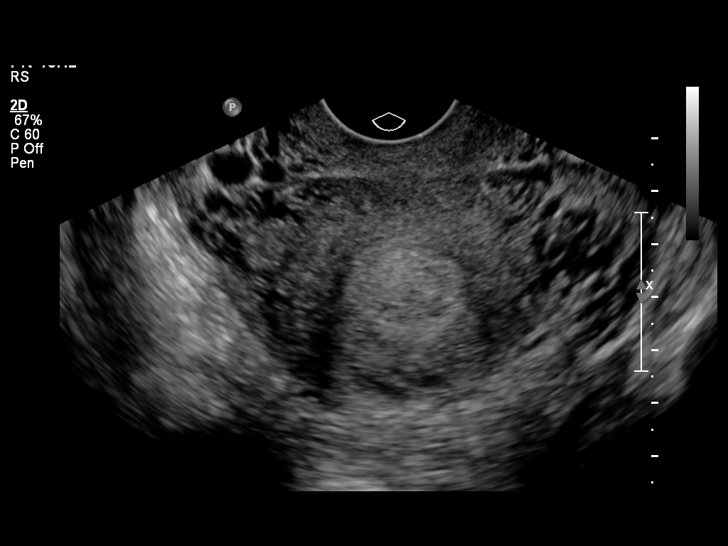
[im 24/38]
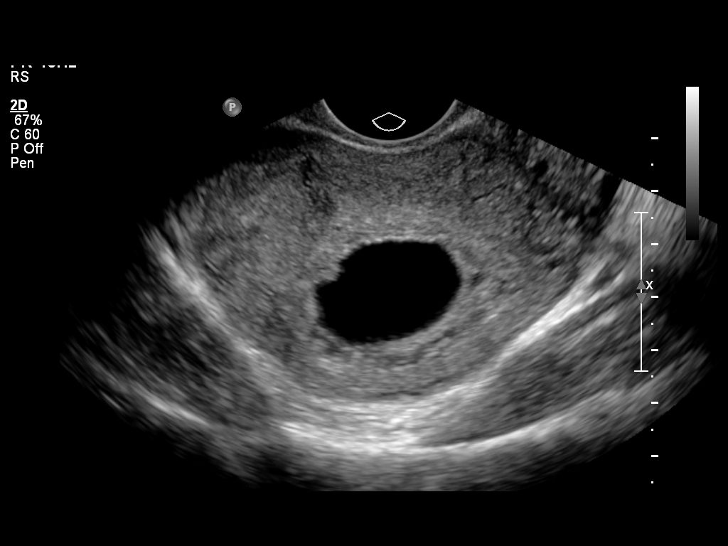
[im 27/38]
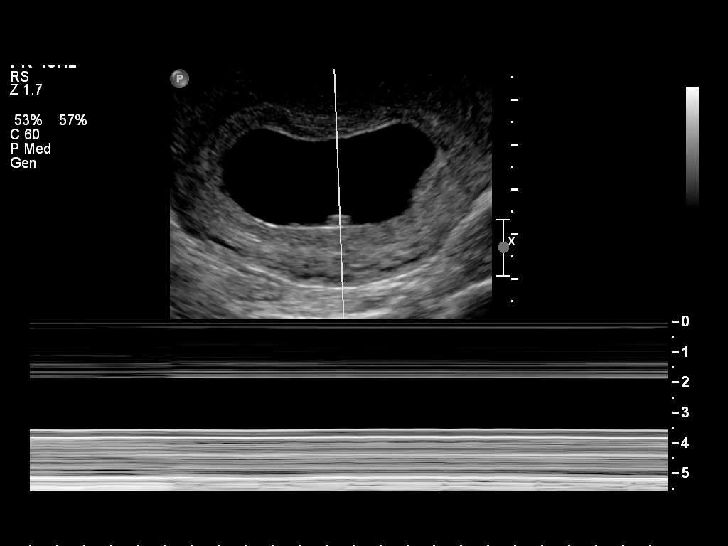
[im 29/38]
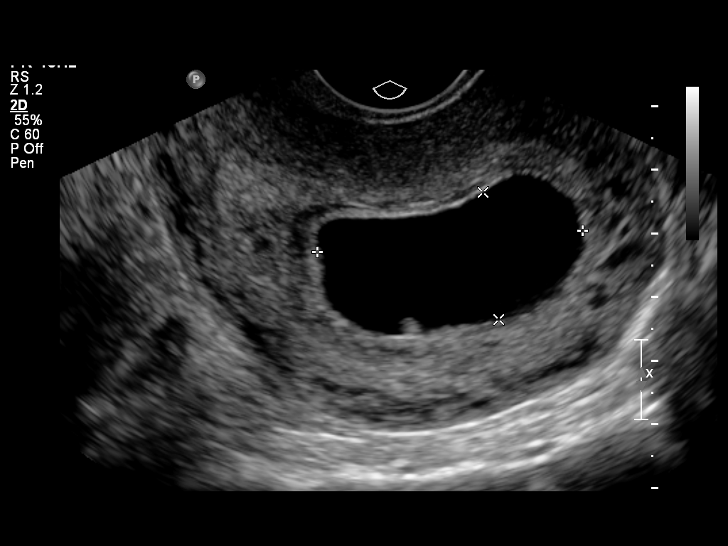
[im 32/38]
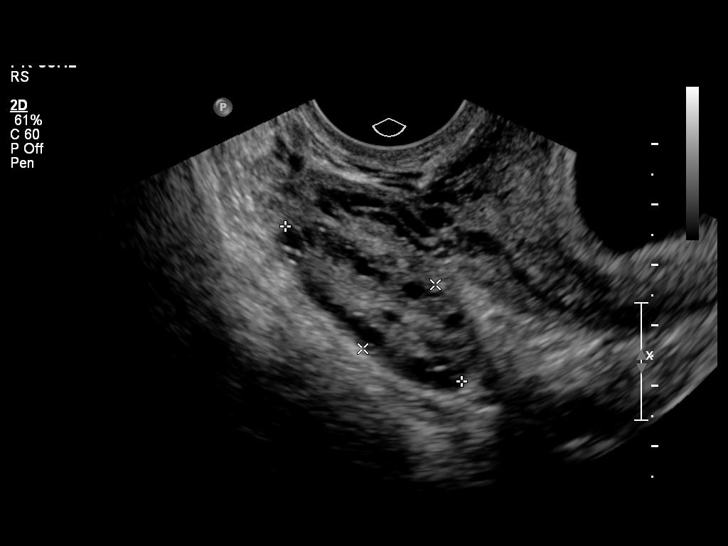
[im 35/38]
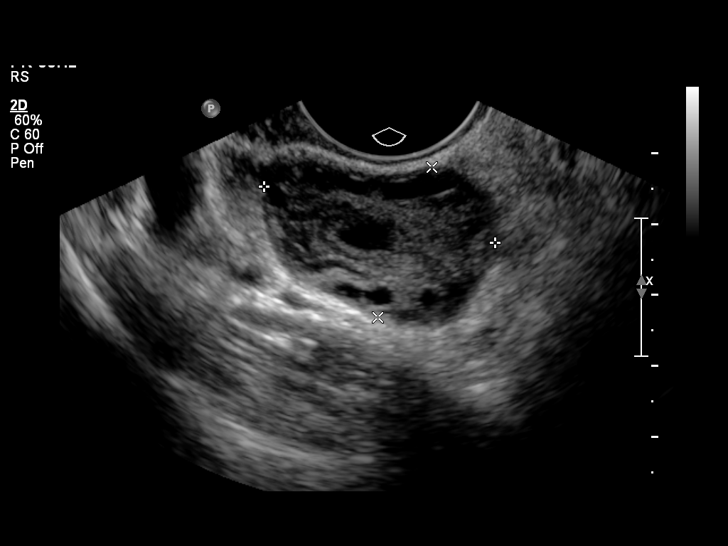
[im 38/38]
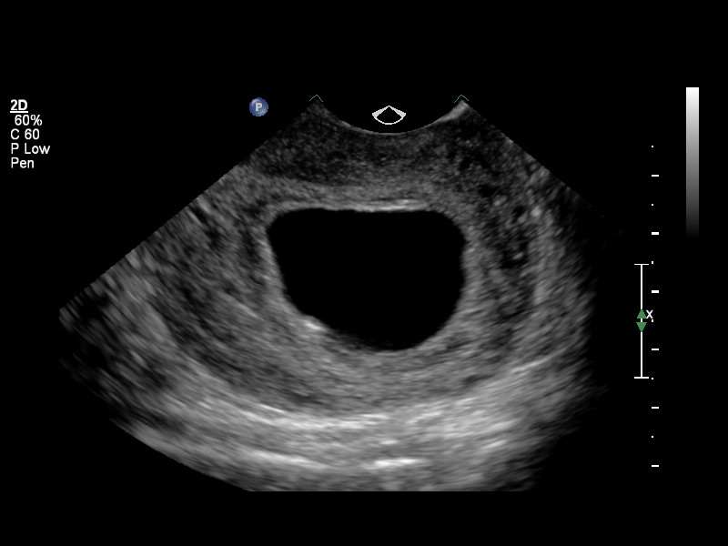

[14 of 28 positions shown; findings below may reference images not displayed]

Intrauterine gestational sac:  Round gestational sac is identified
in the fundus.
Yolk sac: Not identified
Embryo: See below.
Cardiac Activity: None.
Heart Rate: Not applicable bpm

MSD: 34.1 mm  eight w six d  EDC: 02/08/2013

Maternal uterus/adnexae:
Small subchorionic hemorrhages present.  There is no yolk sac.
Only a small soft tissue density is present within the gestational
sac much smaller than one would expect for the appropriately sized
fetus.  Length is 5.8 mm.  Left ovary corpus luteum cyst.  Right
ovary unremarkable.  No free fluid.  No adnexal mass.
IMPRESSION: There is a large gestational sac with only a 6 mm soft tissue
density and no yolk sac and no cardiac activity.  Differential
diagnosis includes subacute fetal demise with involution of the
fetus or blighted ovum.

## 2013-07-14 IMAGING — US US OB COMP LESS 14 WK
1 series · 14 of 26 positions shown · non-contrast
Comparison: 07/05/2012

CLINICAL DATA: Hemorrhage. Known missed AB.

OBSTETRIC <14 WK ULTRASOUND
TECHNIQUE: Transabdominal ultrasound was performed for evaluation
of the gestation as well as the maternal uterus and adnexal
regions.

[Series 1: us ob comp less 14 wk · 14 of 26 slices shown]
[im 1/26]
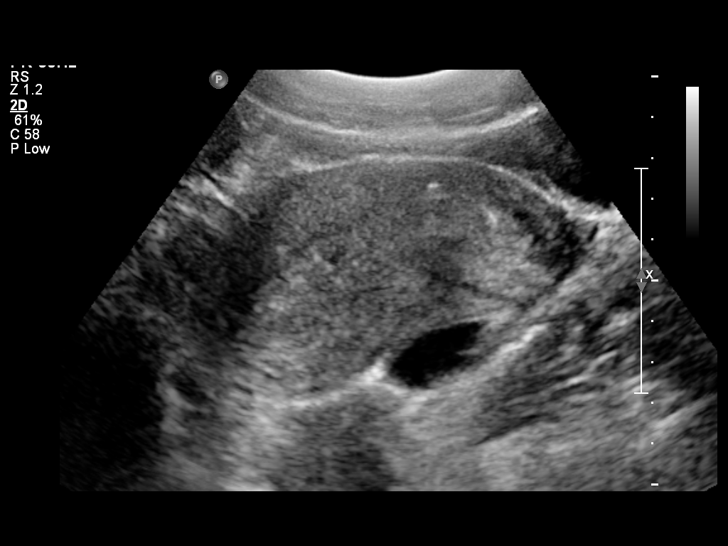
[im 3/26]
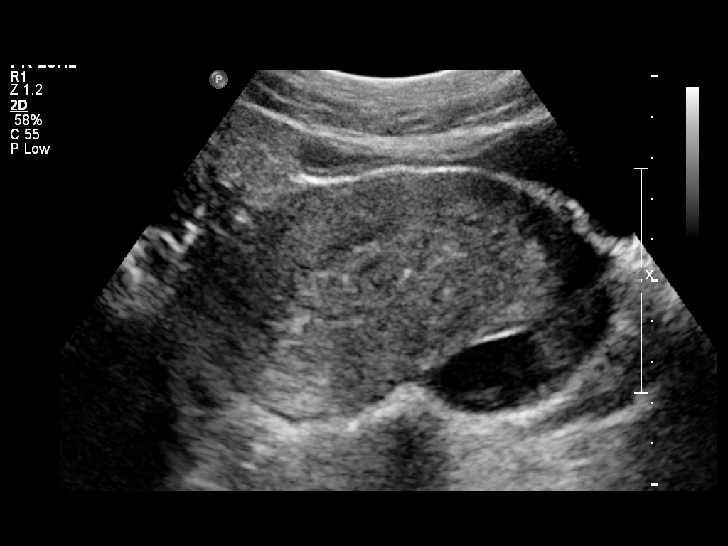
[im 5/26]
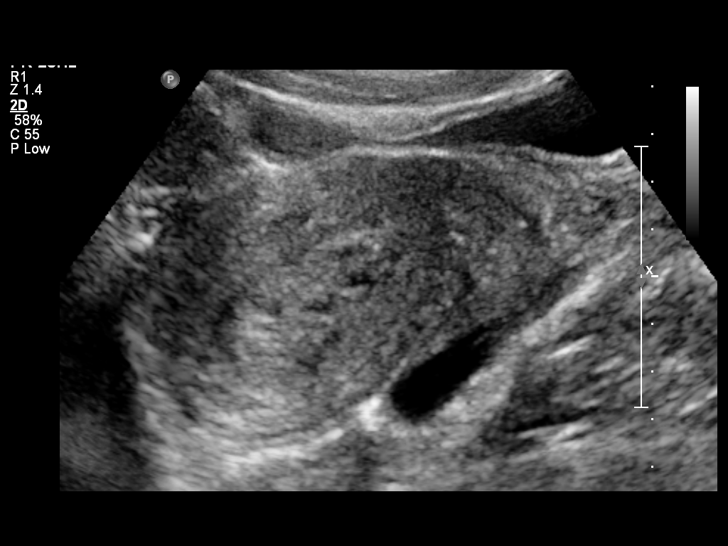
[im 7/26]
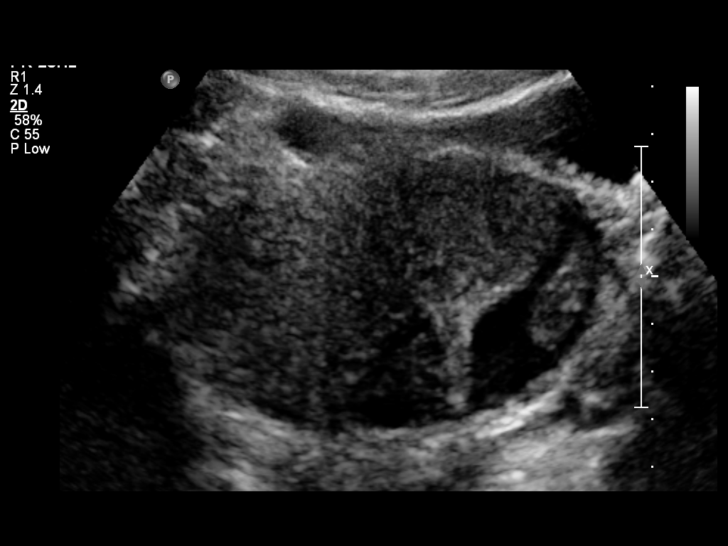
[im 9/26]
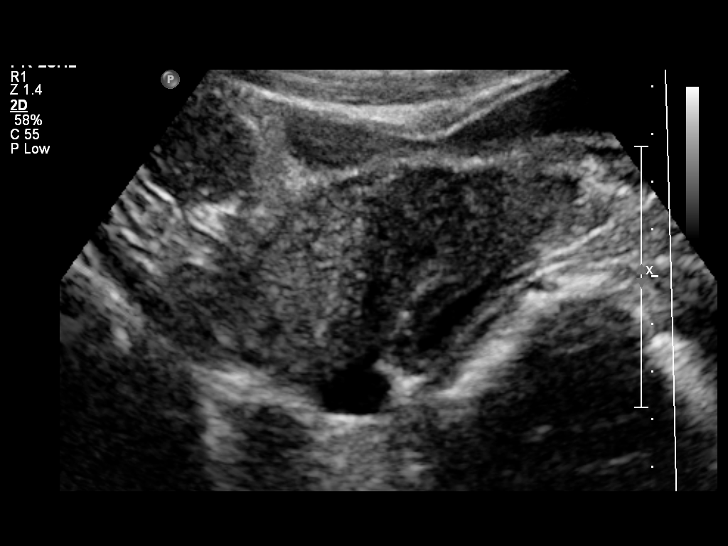
[im 11/26]
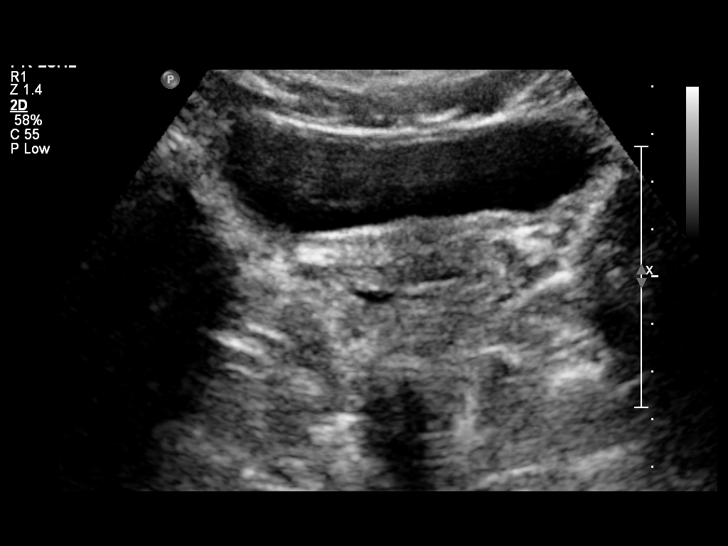
[im 13/26]
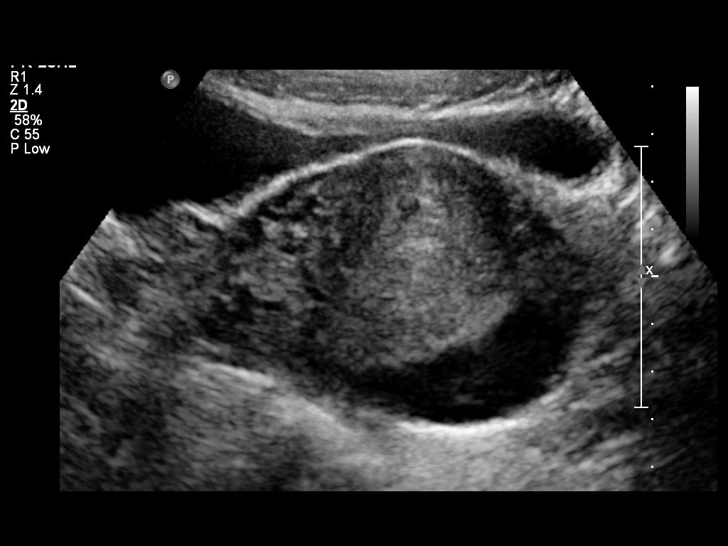
[im 14/26]
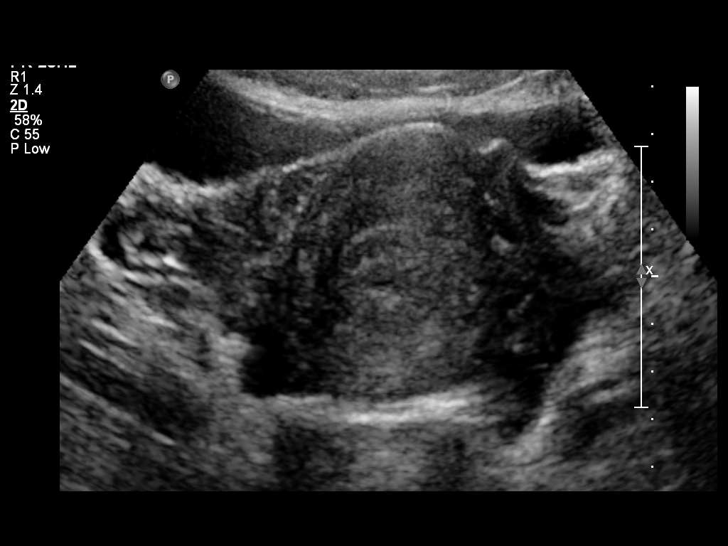
[im 16/26]
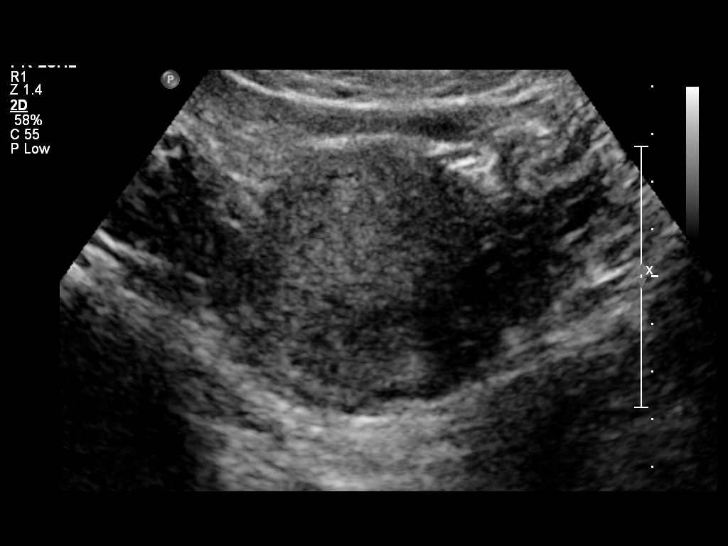
[im 18/26]
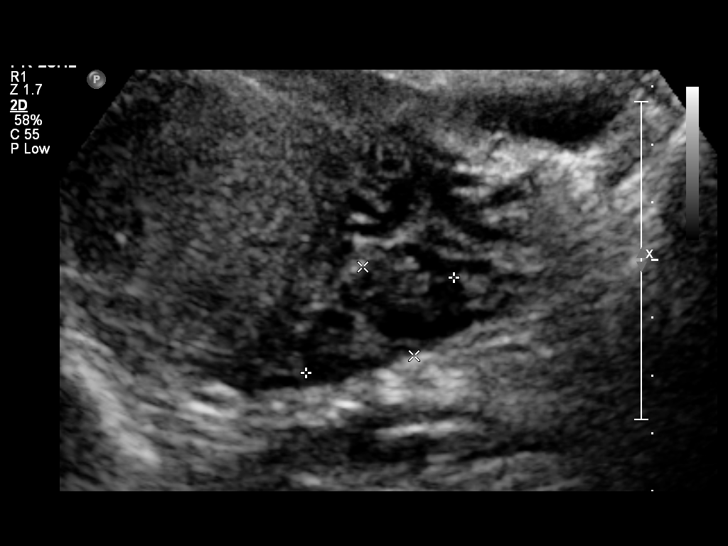
[im 20/26]
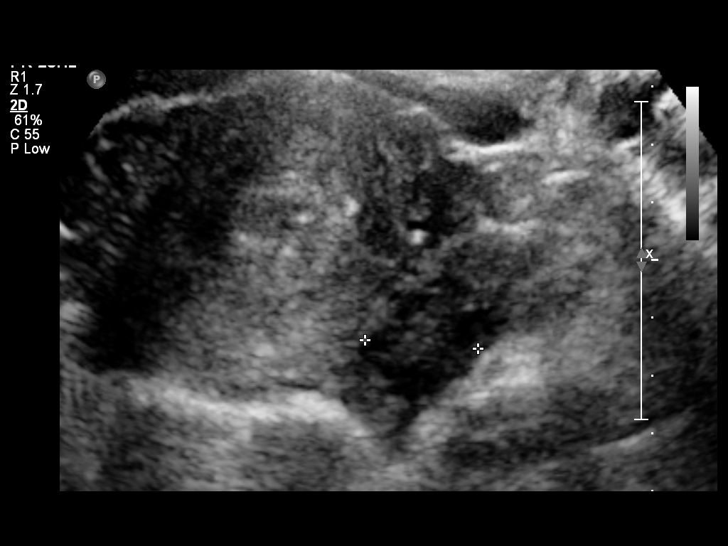
[im 22/26]
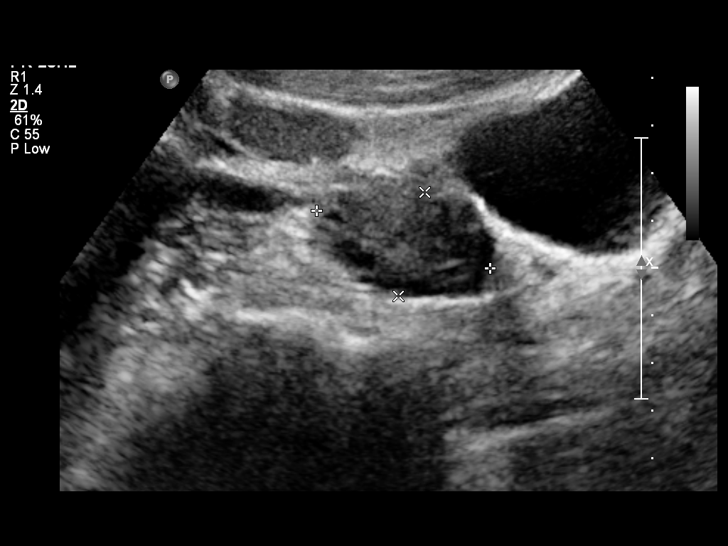
[im 24/26]
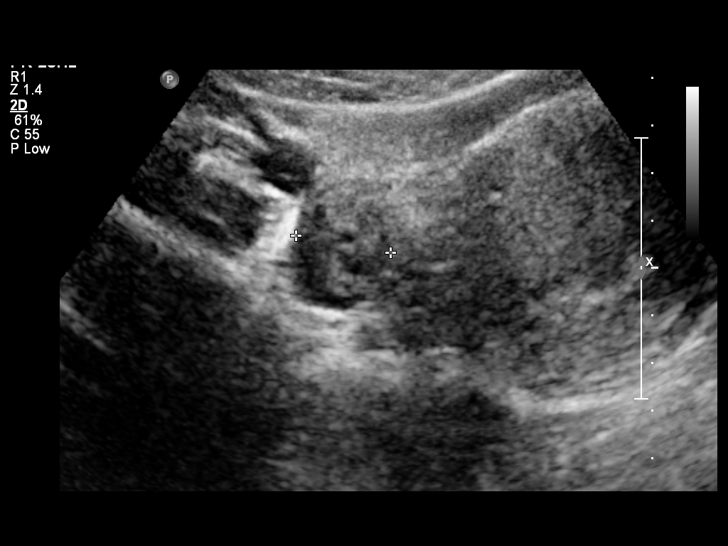
[im 26/26]
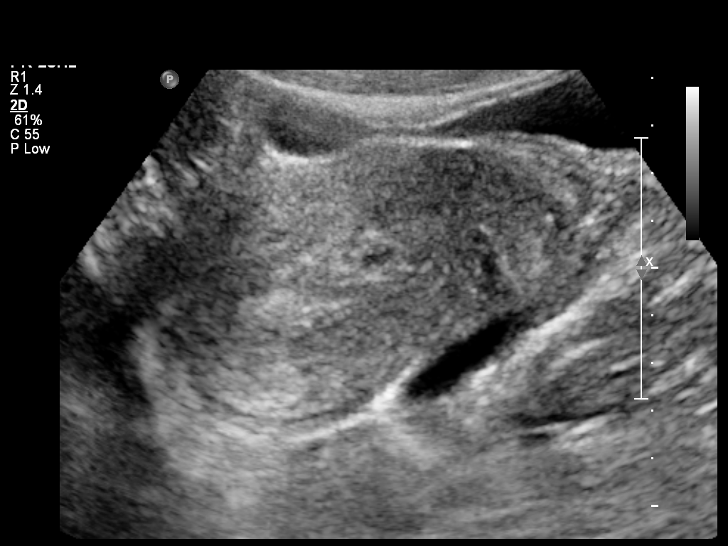

[14 of 26 positions shown; findings below may reference images not displayed]

Intrauterine gestational sac: None
Yolk sac: None
Embryo: None

Maternal uterus/Adnexae:
Endometrial stripe is heterogeneous and 11 mm in thickness.
Findings are consistent with retained products of conception or
clot.  The ovaries have a normal appearance.  Trace free pelvic
fluid identified.
IMPRESSION: 1.  No evidence for intrauterine pregnancy.
2.  Heterogeneous mildly thickened endometrial contents, consistent
with retained products of conception or clot.

## 2013-09-25 ENCOUNTER — Ambulatory Visit: Payer: Self-pay | Admitting: Family Medicine

## 2013-09-25 VITALS — BP 116/68 | HR 86 | Temp 98.7°F | Resp 20 | Ht 62.5 in | Wt 120.4 lb

## 2013-09-25 DIAGNOSIS — R300 Dysuria: Secondary | ICD-10-CM

## 2013-09-25 LAB — POCT UA - MICROSCOPIC ONLY
Casts, Ur, LPF, POC: NEGATIVE
Crystals, Ur, HPF, POC: NEGATIVE
Yeast, UA: NEGATIVE

## 2013-09-25 LAB — POCT URINALYSIS DIPSTICK
Bilirubin, UA: NEGATIVE
Glucose, UA: NEGATIVE
Ketones, UA: NEGATIVE
Nitrite, UA: NEGATIVE
Protein, UA: 100
Spec Grav, UA: <=1.005
Urobilinogen, UA: 0.2
pH, UA: 5.5

## 2013-09-25 MED ORDER — CIPROFLOXACIN HCL 500 MG PO TABS: 500.0000 mg | ORAL_TABLET | Freq: Two times a day (BID) | ORAL | Status: DC

## 2013-09-25 NOTE — Progress Notes (Signed)
[facility] [address] [place] [address] [phone]  Date:  09/25/2013   Name:  Kathy Salazar   DOB:  1991-10-30   MRN:  [id number]  PCP:  Jacquelin Hawking, MD    Chief Complaint: Urinary Tract Infection   History of Present Illness:  Kathy Salazar is a 22 y.o. very pleasant female patient who presents with the following:  She has noted some lower back pain.  She will feel like she needs to urinate but then there will not be much urine there.  She notes burning with urination.   She has tried cranberry juice and is drinking a lot of water.   She thinks she may have seen a tiny amount of blood in her urine.   LMP 09/10/13.   She is otherwise generally healthy.    She has never had a UTI in the past.   No vaginal symptoms.  She is married and has not had any new sexual partners   Patient Active Problem List   Diagnosis Date Noted  . Spontaneous miscarriage 07/25/2012  . Amenorrhea 07/02/2012  . OTHER SPECIFIED VISUAL DISTURBANCES 12/25/2010    Past Medical History  Diagnosis Date  . No pertinent past medical history     Past Surgical History  Procedure Laterality Date  . Appendectomy      History  Substance Use Topics  . Smoking status: Never Smoker   . Smokeless tobacco: Not on file  . Alcohol Use: No    Family History  Problem Relation Age of Onset  . Miscarriages / Stillbirths Sister   . Hyperlipidemia Sister     No Known Allergies  Medication list has been reviewed and updated.  Current Outpatient Prescriptions on File Prior to Visit  Medication Sig Dispense Refill  . misoprostol (CYTOTEC) 200 MCG tablet Put 4 tablets in rectum at 12 midnight tonight  4 tablet  0  . Prenatal Vit-Fe Fumarate-FA (PRENATAL MULTIVITAMIN) TABS Take 1 tablet by mouth every morning.       No current facility-administered medications on file prior to visit.    Review of Systems:  As per HPI- otherwise negative.   Physical  Examination: Filed Vitals:   09/25/13 1652  BP: 116/68  Pulse: 86  Temp: 98.7 F (37.1 C)  Resp: 20   Filed Vitals:   09/25/13 1652  Height: 5' 2.5" (1.588 m)  Weight: 120 lb 6.4 oz (54.613 kg)   Body mass index is 21.66 kg/(m^2). Ideal Body Weight: Weight in (lb) to have BMI = 25: 138.6  GEN: WDWN, NAD, Non-toxic, A & O x 3 HEENT: Atraumatic, Normocephalic. Neck supple. No masses, No LAD. Ears and Nose: No external deformity. CV: RRR, No M/G/R. No JVD. No thrill. No extra heart sounds. PULM: CTA B, no wheezes, crackles, rhonchi. No retractions. No resp. distress. No accessory muscle use. ABD: S, NT, ND, +BS. No rebound. No HSM. EXTR: No c/c/e NEURO Normal gait.  PSYCH: Normally interactive. Conversant. Not depressed or anxious appearing.  Calm demeanor.  No CVA tenderness   Results for orders placed in visit on 09/25/13  POCT UA - MICROSCOPIC ONLY      Result Value Range   WBC, Ur, HPF, POC tntc     RBC, urine, microscopic tntc     Bacteria, U Microscopic 1+     Mucus, UA trace     Epithelial cells, urine per micros 0-3     Crystals, Ur, HPF, POC neg  Casts, Ur, LPF, POC neg     Yeast, UA neg    POCT URINALYSIS DIPSTICK      Result Value Range   Color, UA yellow     Clarity, UA cloudy     Glucose, UA neg     Bilirubin, UA neg     Ketones, UA neg     Spec Grav, UA <=1.005     Blood, UA large     pH, UA 5.5     Protein, UA 100     Urobilinogen, UA 0.2     Nitrite, UA neg     Leukocytes, UA small (1+)       Assessment and Plan: Burning with urination - Plan: POCT UA - Microscopic Only, POCT urinalysis dipstick, ciprofloxacin (CIPRO) 500 MG tablet  UTI- treat with cipro.  See patient instructions for more details.   Close follow-up if not better.    Signed Abbe Amsterdam, MD  She declines a uriner culture today due to cost.

## 2013-09-25 NOTE — Patient Instructions (Signed)
Use the cipro as directed.  If you are not feeling better in the next couple of days please let me know- Sooner if worse.   Continue to drink plenty of water.

## 2013-11-02 ENCOUNTER — Encounter: Payer: Self-pay | Admitting: Family Medicine

## 2013-11-02 ENCOUNTER — Ambulatory Visit (INDEPENDENT_AMBULATORY_CARE_PROVIDER_SITE_OTHER): Payer: Self-pay | Admitting: Family Medicine

## 2013-11-02 VITALS — BP 114/63 | HR 78 | Temp 98.0°F | Ht 63.0 in | Wt 123.5 lb

## 2013-11-02 DIAGNOSIS — IMO0001 Reserved for inherently not codable concepts without codable children: Secondary | ICD-10-CM

## 2013-11-02 DIAGNOSIS — Z309 Encounter for contraceptive management, unspecified: Secondary | ICD-10-CM

## 2013-11-02 LAB — POCT URINE PREGNANCY: Preg Test, Ur: NEGATIVE

## 2013-11-02 MED ORDER — NORGESTIMATE-ETH ESTRADIOL 0.25-35 MG-MCG PO TABS: 1.0000 | ORAL_TABLET | Freq: Every day | ORAL | Status: DC

## 2013-11-03 DIAGNOSIS — Z309 Encounter for contraceptive management, unspecified: Secondary | ICD-10-CM | POA: Insufficient documentation

## 2013-11-03 NOTE — Patient Instructions (Signed)
It has been a pleasure to see you today. Please take the medications as prescribed. Make your next appointment with your primary doctor in 1-2 months or sooner if needed.

## 2013-11-03 NOTE — Progress Notes (Signed)
Family Medicine Office Visit Note   Subjective:   Patient ID: Kathy Salazar, female  DOB: 1991/03/30, 22 y.o.. MRN: [id number]   Pt that comes today for contraception management. She reports has been on Sprintec in the past with good tolerance and is requesting this medication. Her menses are reported to be irregular with her LMP the 2nd of this month. Denies hx of blood clots, she is not smoker.   Review of Systems:  Per HPI  Objective:   Physical Exam: Gen:  NAD HEENT: Moist mucous membranes  CV: Regular rate and rhythm, no murmurs  PULM: Clear to auscultation bilaterally.  ABD: Soft, non tender, non distended EXT: No edema Neuro: Alert and oriented x3. Grossly intact.   Assessment & Plan:

## 2013-11-03 NOTE — Assessment & Plan Note (Signed)
Negative Upreg. Discussed benefits and side effects of medication and pt is agreeable to start Spritec which is prescribed today. Questions regarding medication were answered.  F/u with primary doctor

## 2014-10-11 ENCOUNTER — Encounter: Payer: Self-pay | Admitting: Family Medicine

## 2015-10-21 ENCOUNTER — Other Ambulatory Visit (INDEPENDENT_AMBULATORY_CARE_PROVIDER_SITE_OTHER): Payer: Self-pay

## 2015-10-21 DIAGNOSIS — Z3481 Encounter for supervision of other normal pregnancy, first trimester: Secondary | ICD-10-CM

## 2015-10-22 LAB — CULTURE, OB URINE
Colony Count: NO GROWTH
Organism ID, Bacteria: NO GROWTH

## 2015-10-22 LAB — HIV ANTIBODY (ROUTINE TESTING W REFLEX): HIV 1&2 Ab, 4th Generation: NONREACTIVE

## 2015-10-24 LAB — OBSTETRIC PANEL
Antibody Screen: NEGATIVE
Basophils Absolute: 0 10*3/uL (ref 0.0–0.1)
Basophils Relative: 0 % (ref 0–1)
Eosinophils Absolute: 0.1 10*3/uL (ref 0.0–0.7)
Eosinophils Relative: 2 % (ref 0–5)
HCT: 39.4 % (ref 36.0–46.0)
Hemoglobin: 13.7 g/dL (ref 12.0–15.0)
Hepatitis B Surface Ag: NEGATIVE
Lymphocytes Relative: 20 % (ref 12–46)
Lymphs Abs: 1.4 10*3/uL (ref 0.7–4.0)
MCH: 30 pg (ref 26.0–34.0)
MCHC: 34.8 g/dL (ref 30.0–36.0)
MCV: 86.4 fL (ref 78.0–100.0)
MPV: 11.9 fL (ref 8.6–12.4)
Monocytes Absolute: 0.4 10*3/uL (ref 0.1–1.0)
Monocytes Relative: 6 % (ref 3–12)
Neutro Abs: 5 10*3/uL (ref 1.7–7.7)
Neutrophils Relative %: 72 % (ref 43–77)
Platelets: 180 10*3/uL (ref 150–400)
RBC: 4.56 MIL/uL (ref 3.87–5.11)
RDW: 13.3 % (ref 11.5–15.5)
Rh Type: POSITIVE
Rubella: 2.1 Index — ABNORMAL HIGH (ref <0.90)
WBC: 7 10*3/uL (ref 4.0–10.5)

## 2015-10-28 ENCOUNTER — Ambulatory Visit (INDEPENDENT_AMBULATORY_CARE_PROVIDER_SITE_OTHER): Payer: Self-pay | Admitting: Student

## 2015-10-28 ENCOUNTER — Other Ambulatory Visit (HOSPITAL_COMMUNITY): Admission: RE | Admit: 2015-10-28 | Payer: Self-pay | Source: Ambulatory Visit | Admitting: Family Medicine

## 2015-10-28 ENCOUNTER — Other Ambulatory Visit (HOSPITAL_COMMUNITY)
Admission: RE | Admit: 2015-10-28 | Discharge: 2015-10-28 | Disposition: A | Discharge status: Home / Self Care | Payer: Self-pay | Source: Ambulatory Visit | Attending: Family Medicine | Admitting: Family Medicine

## 2015-10-28 ENCOUNTER — Encounter: Payer: Self-pay | Admitting: Student

## 2015-10-28 VITALS — BP 111/67 | HR 81 | Temp 98.0°F | Wt 123.6 lb

## 2015-10-28 DIAGNOSIS — Z3481 Encounter for supervision of other normal pregnancy, first trimester: Secondary | ICD-10-CM

## 2015-10-28 DIAGNOSIS — Z01419 Encounter for gynecological examination (general) (routine) without abnormal findings: Secondary | ICD-10-CM | POA: Insufficient documentation

## 2015-10-28 NOTE — Progress Notes (Addendum)
New OB 24 y/o G3P1011 at unsure gestational age who presents for new OB appointment, Preg c/b : none S: This is a planned pregnancy, denies contractions or cramping at this time, denies VB but did have some spotting after intercourse last week, denies vaginal discharge, irritation or malodor.   PMH: denies PSH: appendectomy at age 24 Meds: PNVs OB:  G3P1011  G1 SVD x1 at term G2 first trimester miscarriage Gyn LMP 02/2015 but periods irregular Denies h/o STDs or abdnormal pap smears Fam Hx: denies bleeding disorders in the family, denies birth defects or mental retardation Social Hx: Lives with son and father of baby, denies etoh, tobacco or drug use  Exam BP 111/67 mmHg  Pulse 81  Temp(Src) 98 F (36.7 C)  Wt 123 lb 9.6 oz (56.065 kg) Gen: NAD CV: RRR, no murmurs auscultated Pulm: CTAB Abd- non tender  Pelvic: nl EGBUS, normal vaginal vault mild white discharge, normal appearing cervix, no CMT, normal mobile uterus, no adnexal tenderness or masses  A/P IUP Routine OB: continue PNVs, OB labs reviewed, dating US scheduled, f/u GC/Ct and pap smear Counseled on hydration and to drink 8-10 glasses of water per day Labor precautions given  Follow up in 4 weeks  Shiara Mcgough A. Kennon RoundsHaney MD, MS Family Medicine Resident PGY-2 Pager [phone]

## 2015-10-28 NOTE — Patient Instructions (Addendum)
Follow up in 1 month You were scheduled for an OB dating ultrasound. We will help you to make with appointment Drink 8-10 glasses of water per day If you have bleeding like a period, severe cramping or loss of fluid like you broke your water, go to the ED right away If you have questions or concerns please call the office at [phone]

## 2015-10-31 LAB — CERVICOVAGINAL ANCILLARY ONLY
Chlamydia: NEGATIVE
Neisseria Gonorrhea: NEGATIVE

## 2015-11-01 LAB — CYTOLOGY - PAP

## 2015-11-11 ENCOUNTER — Telehealth: Payer: Self-pay | Admitting: Family Medicine

## 2015-11-11 NOTE — Telephone Encounter (Signed)
[facility] called because the patient did a NO-Show for her US. jw

## 2015-11-12 NOTE — Telephone Encounter (Signed)
Please call patient to see if she will reschedule ultrasound. Will forward to Dr. Doroteo GlassmanPhelps as this is her OB patient.

## 2015-11-14 NOTE — Telephone Encounter (Signed)
Spoke to patient over phone. No reason given to why US was missed. She states that adopt-a-mom has rescheduled her US for January. Just need to make sure this is accurate at next appointment.

## 2015-11-29 ENCOUNTER — Ambulatory Visit (INDEPENDENT_AMBULATORY_CARE_PROVIDER_SITE_OTHER): Payer: Self-pay | Admitting: Family Medicine

## 2015-11-29 VITALS — BP 109/68 | HR 72 | Temp 97.9°F | Wt 129.2 lb

## 2015-11-29 DIAGNOSIS — Z3492 Encounter for supervision of normal pregnancy, unspecified, second trimester: Secondary | ICD-10-CM

## 2015-11-29 NOTE — Progress Notes (Signed)
S: Patient reports no concerns today. No vaginal bleeding, discharge, fluids. No cramping or contractions. She states she went to a facility and received a dating ultrasound. She states that her EDC was 05/19/2016.  O: BP 109/68 mmHg  Pulse 72  Temp(Src) 97.9 F (36.6 C)  Wt 129 lb 3.2 oz (58.605 kg)  Gen: well appearing Lungs: Clear to auscultation bilaterally. Unlabored work of breathing. No wheezing or rales. CVS: Regular rate and rhythm. Normal S1 and S2. No heart murmurs present. No extra heart sounds Abdomen: soft, non-tender, non-distended  A/P: G3P1001 currently at 2420w3d by unverified 1st trimester ultrasound - follow-up in 4 weeks - 1 hour glucola for early GDM screening - Discussed obtaining results from ultrasound - dating ultrasound scheduled for January - precautions given

## 2015-11-29 NOTE — Patient Instructions (Signed)
Thank you for coming to see me today. It was a pleasure. Today we talked about:   Pregnancy: I would like you to make an appointment with the lab for a 1-hour glucose test. Also, please bring the documentation that you have for your dating ultrasound so we can verify its credibility for accurate dating of your pregnancy. Before you leave, I am getting genetic testing done for you. Please follow-up in 4 weeks for your next prenatal visit  Drink 8-10 glasses of water per day If you have bleeding like a period, severe cramping or loss of fluid like you broke your water, go to the ED right away  If you have any questions or concerns, please do not hesitate to call the office at [phone].  Sincerely,  Jacquelin Hawking, MD   Second Trimester of Pregnancy The second trimester is from week 13 through week 28, months 4 through 6. The second trimester is often a time when you feel your best. Your body has also adjusted to being pregnant, and you begin to feel better physically. Usually, morning sickness has lessened or quit completely, you may have more energy, and you may have an increase in appetite. The second trimester is also a time when the fetus is growing rapidly. At the end of the sixth month, the fetus is about 9 inches long and weighs about 1 pounds. You will likely begin to feel the baby move (quickening) between 18 and 20 weeks of the pregnancy. BODY CHANGES Your body goes through many changes during pregnancy. The changes vary from woman to woman.   Your weight will continue to increase. You will notice your lower abdomen bulging out.  You may begin to get stretch marks on your hips, abdomen, and breasts.  You may develop headaches that can be relieved by medicines approved by your health care provider.  You may urinate more often because the fetus is pressing on your bladder.  You may develop or continue to have heartburn as a result of your pregnancy.  You may develop  constipation because certain hormones are causing the muscles that push waste through your intestines to slow down.  You may develop hemorrhoids or swollen, bulging veins (varicose veins).  You may have back pain because of the weight gain and pregnancy hormones relaxing your joints between the bones in your pelvis and as a result of a shift in weight and the muscles that support your balance.  Your breasts will continue to grow and be tender.  Your gums may bleed and may be sensitive to brushing and flossing.  Dark spots or blotches (chloasma, mask of pregnancy) may develop on your face. This will likely fade after the baby is born.  A dark line from your belly button to the pubic area (linea nigra) may appear. This will likely fade after the baby is born.  You may have changes in your hair. These can include thickening of your hair, rapid growth, and changes in texture. Some women also have hair loss during or after pregnancy, or hair that feels dry or thin. Your hair will most likely return to normal after your baby is born. WHAT TO EXPECT AT YOUR PRENATAL VISITS During a routine prenatal visit:  You will be weighed to make sure you and the fetus are growing normally.  Your blood pressure will be taken.  Your abdomen will be measured to track your baby's growth.  The fetal heartbeat will be listened to.  Any test results from  the previous visit will be discussed. Your health care provider may ask you:  How you are feeling.  If you are feeling the baby move.  If you have had any abnormal symptoms, such as leaking fluid, bleeding, severe headaches, or abdominal cramping.  If you are using any tobacco products, including cigarettes, chewing tobacco, and electronic cigarettes.  If you have any questions. Other tests that may be performed during your second trimester include:  Blood tests that check for:  Low iron levels (anemia).  Gestational diabetes (between 24 and 28  weeks).  Rh antibodies.  Urine tests to check for infections, diabetes, or protein in the urine.  An ultrasound to confirm the proper growth and development of the baby.  An amniocentesis to check for possible genetic problems.  Fetal screens for spina bifida and Down syndrome.  HIV (human immunodeficiency virus) testing. Routine prenatal testing includes screening for HIV, unless you choose not to have this test. HOME CARE INSTRUCTIONS   Avoid all smoking, herbs, alcohol, and unprescribed drugs. These chemicals affect the formation and growth of the baby.  Do not use any tobacco products, including cigarettes, chewing tobacco, and electronic cigarettes. If you need help quitting, ask your health care provider. You may receive counseling support and other resources to help you quit.  Follow your health care provider's instructions regarding medicine use. There are medicines that are either safe or unsafe to take during pregnancy.  Exercise only as directed by your health care provider. Experiencing uterine cramps is a good sign to stop exercising.  Continue to eat regular, healthy meals.  Wear a good support bra for breast tenderness.  Do not use hot tubs, steam rooms, or saunas.  Wear your seat belt at all times when driving.  Avoid raw meat, uncooked cheese, cat litter boxes, and soil used by cats. These carry germs that can cause birth defects in the baby.  Take your prenatal vitamins.  Take 1500-2000 mg of calcium daily starting at the 20th week of pregnancy until you deliver your baby.  Try taking a stool softener (if your health care provider approves) if you develop constipation. Eat more high-fiber foods, such as fresh vegetables or fruit and whole grains. Drink plenty of fluids to keep your urine clear or pale yellow.  Take warm sitz baths to soothe any pain or discomfort caused by hemorrhoids. Use hemorrhoid cream if your health care provider approves.  If you  develop varicose veins, wear support hose. Elevate your feet for 15 minutes, 3-4 times a day. Limit salt in your diet.  Avoid heavy lifting, wear low heel shoes, and practice good posture.  Rest with your legs elevated if you have leg cramps or low back pain.  Visit your dentist if you have not gone yet during your pregnancy. Use a soft toothbrush to brush your teeth and be gentle when you floss.  A sexual relationship may be continued unless your health care provider directs you otherwise.  Continue to go to all your prenatal visits as directed by your health care provider. SEEK MEDICAL CARE IF:   You have dizziness.  You have mild pelvic cramps, pelvic pressure, or nagging pain in the abdominal area.  You have persistent nausea, vomiting, or diarrhea.  You have a bad smelling vaginal discharge.  You have pain with urination. SEEK IMMEDIATE MEDICAL CARE IF:   You have a fever.  You are leaking fluid from your vagina.  You have spotting or bleeding from your vagina.  You have severe abdominal cramping or pain.  You have rapid weight gain or loss.  You have shortness of breath with chest pain.  You notice sudden or extreme swelling of your face, hands, ankles, feet, or legs.  You have not felt your baby move in over an hour.  You have severe headaches that do not go away with medicine.  You have vision changes.   This information is not intended to replace advice given to you by your health care provider. Make sure you discuss any questions you have with your health care provider.   Document Released: 11/20/2001 Document Revised: 12/17/2014 Document Reviewed: 01/27/2013 Elsevier Interactive Patient Education [employer]2016 [employer] Inc.

## 2015-12-11 NOTE — L&D Delivery Note (Signed)
Delivery Note At 12:30 PM a viable and healthy female was delivered via Vaginal, Spontaneous Delivery (Presentation:Compound presentation, OA with right hand across body and by face ).  APGAR: 8, 9; weight  pending.   Placenta status: Intact, Spontaneous.  Cord: 3 vessels with the following complications: None.  Cord pH: not sent  Anesthesia: Epidural  Episiotomy: None Lacerations: 1st degree;Perineal Suture Repair: 3.0 vicryl Est. Blood Loss (mL):  250  Amy Finan, PA delivered infant and repaired laceration under my direct supervision and guidance.   Mom to postpartum.  Baby to Couplet care / Skin to Skin.  Federico FlakeKimberly Niles Suleyma Wafer, MD  05/22/2016, 12:48 PM

## 2015-12-13 ENCOUNTER — Emergency Department (HOSPITAL_COMMUNITY): Payer: Self-pay

## 2015-12-13 ENCOUNTER — Emergency Department (HOSPITAL_COMMUNITY)
Admission: EM | Admit: 2015-12-13 | Discharge: 2015-12-13 | Disposition: A | Discharge status: Home / Self Care | Payer: Self-pay | Attending: Emergency Medicine | Admitting: Emergency Medicine

## 2015-12-13 ENCOUNTER — Encounter (HOSPITAL_COMMUNITY): Payer: Self-pay | Admitting: Emergency Medicine

## 2015-12-13 DIAGNOSIS — Z3A18 18 weeks gestation of pregnancy: Secondary | ICD-10-CM | POA: Insufficient documentation

## 2015-12-13 DIAGNOSIS — Z3481 Encounter for supervision of other normal pregnancy, first trimester: Secondary | ICD-10-CM

## 2015-12-13 DIAGNOSIS — O98312 Other infections with a predominantly sexual mode of transmission complicating pregnancy, second trimester: Secondary | ICD-10-CM | POA: Insufficient documentation

## 2015-12-13 DIAGNOSIS — N939 Abnormal uterine and vaginal bleeding, unspecified: Secondary | ICD-10-CM

## 2015-12-13 DIAGNOSIS — N390 Urinary tract infection, site not specified: Secondary | ICD-10-CM

## 2015-12-13 DIAGNOSIS — R319 Hematuria, unspecified: Secondary | ICD-10-CM

## 2015-12-13 DIAGNOSIS — A64 Unspecified sexually transmitted disease: Secondary | ICD-10-CM | POA: Insufficient documentation

## 2015-12-13 DIAGNOSIS — O2342 Unspecified infection of urinary tract in pregnancy, second trimester: Secondary | ICD-10-CM | POA: Insufficient documentation

## 2015-12-13 LAB — WET PREP, GENITAL
Sperm: NONE SEEN
Trich, Wet Prep: NONE SEEN
Yeast Wet Prep HPF POC: NONE SEEN

## 2015-12-13 LAB — URINALYSIS, ROUTINE W REFLEX MICROSCOPIC
Bilirubin Urine: NEGATIVE
Glucose, UA: NEGATIVE mg/dL
Ketones, ur: NEGATIVE mg/dL
Nitrite: NEGATIVE
Protein, ur: >300 mg/dL — AB
Specific Gravity, Urine: 1.016 (ref 1.005–1.030)
pH: 6 (ref 5.0–8.0)

## 2015-12-13 LAB — HCG, QUANTITATIVE, PREGNANCY: hCG, Beta Chain, Quant, S: 15786 m[IU]/mL — ABNORMAL HIGH (ref <5)

## 2015-12-13 LAB — GC/CHLAMYDIA PROBE AMP (~~LOC~~) NOT AT ARMC
Chlamydia: NEGATIVE
Neisseria Gonorrhea: NEGATIVE

## 2015-12-13 LAB — URINE MICROSCOPIC-ADD ON

## 2015-12-13 MED ORDER — AZITHROMYCIN 250 MG PO TABS
1000.0000 mg | ORAL_TABLET | Freq: Once | ORAL | Status: AC
  Administered 2015-12-13: 1000 mg via ORAL
  Filled 2015-12-13: qty 4

## 2015-12-13 MED ORDER — METRONIDAZOLE 500 MG PO TABS: 500.0000 mg | ORAL_TABLET | Freq: Two times a day (BID) | ORAL | Status: DC

## 2015-12-13 MED ORDER — LIDOCAINE HCL (PF) 1 % IJ SOLN
INTRAMUSCULAR | Status: AC
  Administered 2015-12-13: 5 mL
  Filled 2015-12-13: qty 5

## 2015-12-13 MED ORDER — CEPHALEXIN 250 MG PO CAPS
500.0000 mg | ORAL_CAPSULE | Freq: Once | ORAL | Status: AC
  Administered 2015-12-13: 500 mg via ORAL
  Filled 2015-12-13: qty 2

## 2015-12-13 MED ORDER — CEPHALEXIN 500 MG PO CAPS: 500.0000 mg | ORAL_CAPSULE | Freq: Two times a day (BID) | ORAL | Status: DC

## 2015-12-13 MED ORDER — CEFTRIAXONE SODIUM 250 MG IJ SOLR
250.0000 mg | Freq: Once | INTRAMUSCULAR | Status: AC
  Administered 2015-12-13: 250 mg via INTRAMUSCULAR
  Filled 2015-12-13: qty 250

## 2015-12-13 NOTE — ED Provider Notes (Signed)
CSN: [id number]     Arrival date & time 12/13/15  [id number] History  By signing my name below, I, Kathy Salazar, attest that this documentation has been prepared under the direction and in the presence of Kathy CrumbleAdeleke Gordana Kewley, MD. Electronically Signed: Ronney LionSuzanne Salazar, ED Scribe. 12/13/2015. 4:07 AM.     Chief Complaint  Patient presents with  . Dysuria  . Urinary Frequency  . Hematuria   The history is provided by the patient. No language interpreter was used.    HPI Comments: Kathy Salazar is a 25 y.o. female currently pregnant. who presents to the Emergency Department complaining of dysuria, hematuria, and urinary frequency (30 episodes per hour, per patient). She also complains of suprapubic abdominal worsened by movement. Patient states she has her first ultrasound scheduled on 12/29/14 and at that time will have a gestational age determined - patient states her LMP was on 02/2015, but she states she has irregular menses and is unsure of the gestational age of the fetus. She reports a history of UTI but states her symptoms today feel different. She denies vomiting, diarrhea, vaginal bleeding.   Past Medical History  Diagnosis Date  . No pertinent past medical history    Past Surgical History  Procedure Laterality Date  . Appendectomy     Family History  Problem Relation Age of Onset  . Miscarriages / Stillbirths Sister   . Hyperlipidemia Sister    Social History  Substance Use Topics  . Smoking status: Never Smoker   . Smokeless tobacco: None  . Alcohol Use: No   OB History    Gravida Para Term Preterm AB TAB SAB Ectopic Multiple Living   3 1 1  0 0 0 0 0 0 1     Review of Systems A complete 10 system review of systems was obtained and all systems are negative except as noted in the HPI and PMH.    Allergies  Review of patient's allergies indicates no known allergies.  Home Medications   Prior to Admission medications   Medication Sig Start Date End Date Taking? Authorizing Provider   Prenatal Vit-Fe Fumarate-FA (PRENATAL MULTIVITAMIN) TABS Take 1 tablet by mouth every morning.    Historical Provider, MD   BP 123/83 mmHg  Pulse 96  Temp(Src) 97.4 F (36.3 C) (Oral)  Resp 18  Ht 5\' 3"  (1.6 m)  Wt 133 lb (60.328 kg)  BMI 23.57 kg/m2  SpO2 99% Physical Exam  Constitutional: She is oriented to person, place, and time. She appears well-developed and well-nourished. No distress.  HENT:  Head: Normocephalic and atraumatic.  Nose: Nose normal.  Mouth/Throat: Oropharynx is clear and moist. No oropharyngeal exudate.  Eyes: Conjunctivae and EOM are normal. Pupils are equal, round, and reactive to light. No scleral icterus.  Neck: Normal range of motion. Neck supple. No JVD present. No tracheal deviation present. No thyromegaly present.  Cardiovascular: Normal rate, regular rhythm and normal heart sounds.  Exam reveals no gallop and no friction rub.   No murmur heard. Pulmonary/Chest: Effort normal and breath sounds normal. No respiratory distress. She has no wheezes. She exhibits no tenderness.  Abdominal: Soft. Bowel sounds are normal. She exhibits no distension and no mass. There is tenderness. There is no rebound and no guarding.  Suprapubic tenderness and gravid uterus.  Genitourinary: Vaginal discharge found.  No bleeding.  No CMT or adnexal tenderness.  Musculoskeletal: Normal range of motion. She exhibits no edema or tenderness.  Lymphadenopathy:    She has no cervical adenopathy.  Neurological: She is alert and oriented to person, place, and time. No cranial nerve deficit. She exhibits normal muscle tone.  Skin: Skin is warm and dry. No rash noted. No erythema. No pallor.  Nursing note and vitals reviewed.   ED Course  Procedures (including critical care time)  DIAGNOSTIC STUDIES: Oxygen Saturation is 99% on RA, normal by my interpretation.    COORDINATION OF CARE: 3:05 AM - Discussed treatment plan with pt at bedside which includes symptomatic medications,  ultrasound, and pelvic exam. Pt verbalized understanding and agreed to plan.   Labs Review Labs Reviewed  WET PREP, GENITAL - Abnormal; Notable for the following:    Clue Cells Wet Prep HPF POC PRESENT (*)    WBC, Wet Prep HPF POC MANY (*)    All other components within normal limits  URINALYSIS, ROUTINE W REFLEX MICROSCOPIC (NOT AT [facility]) - Abnormal; Notable for the following:    Color, Urine RED (*)    APPearance TURBID (*)    Hgb urine dipstick LARGE (*)    Protein, ur >300 (*)    Leukocytes, UA MODERATE (*)    All other components within normal limits  URINE MICROSCOPIC-ADD ON - Abnormal; Notable for the following:    Squamous Epithelial / LPF 0-5 (*)    Bacteria, UA MANY (*)    All other components within normal limits  HCG, QUANTITATIVE, PREGNANCY - Abnormal; Notable for the following:    hCG, Beta Chain, Quant, S 15786 (*)    All other components within normal limits  GC/CHLAMYDIA PROBE AMP ([place]) NOT AT [facility]    Imaging Review US Ob Comp + 14 Wk  12/13/2015  CLINICAL DATA:  Acute onset of vaginal spotting.  Initial encounter. EXAM: LIMITED OBSTETRIC ULTRASOUND FINDINGS: Number of Fetuses: 1 Heart Rate:  136 bpm Movement: Yes Presentation: Breech Placental Location: Posterior Previa: No Amniotic Fluid (Subjective):  Within normal limits. BPD:  3.84 cm 17 w 5 d MATERNAL FINDINGS: Cervix:  Appears closed. Uterus/Adnexae:  No abnormality visualized. IMPRESSION: Single live intrauterine pregnancy noted, with a biparietal diameter of 3.8 cm, corresponding to a gestational age of [redacted] weeks 5 days. This reflects an estimated date of delivery of May 17, 2016. The cervix remains closed. No evidence of placenta previa. This exam is performed on an emergent basis and does not comprehensively evaluate fetal size, dating, or anatomy; follow-up complete OB US should be considered if further fetal assessment is warranted. Electronically Signed   By: Kathy Salazar M.D.   On: 12/13/2015 04:17    I have personally reviewed and evaluated these images and lab results as part of my medical decision-making.   EKG Interpretation None      MDM   Final diagnoses:  None    Patient presents to the eD for abdominal pain, dysuria and hematuria.  She can not tell me if she is clearly having vaginal bleeding or not.  TV US reveals live IUP.  UA shows an infection.  Pelvic exam is remarkable for vaginal DC without any bleeding seen.  PAtient has had unprotected sex and will be treated for GC/CL in the ED.  In addition, was treated with flagyl and keflex for possible UTI and for BV.  She was instructed to see OB/GYN within 3 days for close follow up.  She demonstrates good understanding of the plan.  She appears well and in NAD.  VS remain within her normal limits and she is safe for DC.  I personally performed  the services described in this documentation, which was scribed in my presence. The recorded information has been reviewed and is accurate.       Kathy Crumble, MD 12/13/15 1428

## 2015-12-13 NOTE — Discharge Instructions (Signed)
Pregnancy and Urinary Tract Infection Kathy Salazar, take antibiotics as directed for your infection.  Your ultrasound results are below.  See OB/GYN within 3 days for close follow up. If symptoms worsen, come back to the ED immediately.  Thank you.   IMPRESSION: Single live intrauterine pregnancy noted, with a biparietal diameter of 3.8 cm, corresponding to a gestational age of [redacted] weeks 5 days. This reflects an estimated date of delivery of May 17, 2016. The cervix remains closed. No evidence of placenta previa.  This exam is performed on an emergent basis and does not comprehensively evaluate fetal size, dating, or anatomy; follow-up complete OB US should be considered if further fetal assessment is warranted.  A urinary tract infection (UTI) is a bacterial infection of the urinary tract. Infection of the urinary tract can include the ureters, kidneys (pyelonephritis), bladder (cystitis), and urethra (urethritis). All pregnant women should be screened for bacteria in the urinary tract. Identifying and treating a UTI will decrease the risk of preterm labor and developing more serious infections in both the mother and baby. CAUSES Bacteria germs cause almost all UTIs.  RISK FACTORS Many factors can increase your chances of getting a UTI during pregnancy. These include:  Having a short urethra.  Poor toilet and hygiene habits.  Sexual intercourse.  Blockage of urine along the urinary tract.  Problems with the pelvic muscles or nerves.  Diabetes.  Obesity.  Bladder problems after having several children.  Previous history of UTI. SIGNS AND SYMPTOMS   Pain, burning, or a stinging feeling when urinating.  Suddenly feeling the need to urinate right away (urgency).  Loss of bladder control (urinary incontinence).  Frequent urination, more than is common with pregnancy.  Lower abdominal or back discomfort.  Cloudy urine.  Blood in the urine  (hematuria).  Fever. When the kidneys are infected, the symptoms may be:  Back pain.  Flank pain on the right side more so than the left.  Fever.  Chills.  Nausea.  Vomiting. DIAGNOSIS  A urinary tract infection is usually diagnosed through urine tests. Additional tests and procedures are sometimes done. These may include:  Ultrasound exam of the kidneys, ureters, bladder, and urethra.  Looking in the bladder with a lighted tube (cystoscopy). TREATMENT Typically, UTIs can be treated with antibiotic medicines.  HOME CARE INSTRUCTIONS   Only take over-the-counter or prescription medicines as directed by your health care provider. If you were prescribed antibiotics, take them as directed. Finish them even if you start to feel better.  Drink enough fluids to keep your urine clear or pale yellow.  Do not have sexual intercourse until the infection is gone and your health care provider says it is okay.  Make sure you are tested for UTIs throughout your pregnancy. These infections often come back. Preventing a UTI in the Future  Practice good toilet habits. Always wipe from front to back. Use the tissue only once.  Do not hold your urine. Empty your bladder as soon as possible when the urge comes.  Do not douche or use deodorant sprays.  Wash with soap and warm water around the genital area and the anus.  Empty your bladder before and after sexual intercourse.  Wear underwear with a cotton crotch.  Avoid caffeine and carbonated drinks. They can irritate the bladder.  Drink cranberry juice or take cranberry pills. This may decrease the risk of getting a UTI.  Do not drink alcohol.  Keep all your appointments and tests as scheduled. SEEK MEDICAL  CARE IF:   Your symptoms get worse.  You are still having fevers 2 or more days after treatment begins.  You have a rash.  You feel that you are having problems with medicines prescribed.  You have abnormal vaginal  discharge. SEEK IMMEDIATE MEDICAL CARE IF:   You have back or flank pain.  You have chills.  You have blood in your urine.  You have nausea and vomiting.  You have contractions of your uterus.  You have a gush of fluid from the vagina. MAKE SURE YOU:  Understand these instructions.   Will watch your condition.   Will get help right away if you are not doing well or get worse.    This information is not intended to replace advice given to you by your health care provider. Make sure you discuss any questions you have with your health care provider.   Document Released: 03/23/2011 Document Revised: 09/16/2013 Document Reviewed: 06/25/2013 Elsevier Interactive Patient Education [employer]  Sexually Transmitted Disease A sexually transmitted disease (STD) is a disease or infection often passed to another person during sex. However, STDs can be passed through nonsexual ways. An STD can be passed through:  Spit (saliva).  Semen.  Blood.  Mucus from the vagina.  Pee (urine). HOW CAN I LESSEN MY CHANCES OF GETTING AN STD?  Use:  Latex condoms.  Water-soluble lubricants with condoms. Do not use petroleum jelly or oils.  Dental dams. These are small pieces of latex that are used as a barrier during oral sex.  Avoid having more than one sex partner.  Do not have sex with someone who has other sex partners.  Do not have sex with anyone you do not know or who is at high risk for an STD.  Avoid risky sex that can break your skin.  Do not have sex if you have open sores on your mouth or skin.  Avoid drinking too much alcohol or taking illegal drugs. Alcohol and drugs can affect your good judgment.  Avoid oral and anal sex acts.  Get shots (vaccines) for HPV and hepatitis.  If you are at risk of being infected with HIV, it is advised that you take a certain medicine daily to prevent HIV infection. This is called pre-exposure prophylaxis (PrEP). You may be  at risk if:  You are a man who has sex with other men (MSM).  You are attracted to the opposite sex (heterosexual) and are having sex with more than one partner.  You take drugs with a needle.  You have sex with someone who has HIV.  Talk with your doctor about if you are at high risk of being infected with HIV. If you begin to take PrEP, get tested for HIV first. Get tested every 3 months for as long as you are taking PrEP.  Get tested for STDs every year if you are sexually active. If you are treated for an STD, get tested again 3 months after you are treated. WHAT SHOULD I DO IF I THINK I HAVE AN STD?  See your doctor.  Tell your sex partner(s) that you have an STD. They should be tested and treated.  Do not have sex until your doctor says it is okay. WHEN SHOULD I GET HELP? Get help right away if:  You have bad belly (abdominal) pain.  You are a man and have puffiness (swelling) or pain in your testicles.  You are a woman and have puffiness in your vagina.  This information is not intended to replace advice given to you by your health care provider. Make sure you discuss any questions you have with your health care provider.   Document Released: 01/03/2005 Document Revised: 12/17/2014 Document Reviewed: 05/22/2013 Elsevier Interactive Patient Education [employer]

## 2015-12-13 NOTE — ED Notes (Signed)
Pt. reports urinary frequency , dysuria and hematuria onset this morning , denies fever or chills. Pt. is 3 1/2 months pregnant , denies vaginal bleeding or abdominal cramps .

## 2015-12-13 NOTE — ED Notes (Signed)
Pelvic cart set up at bedside  

## 2015-12-13 NOTE — ED Notes (Signed)
Patient verbalized understanding of discharge instructions and denies any further needs or questions at this time. VS stable. Patient ambulatory with steady gait.  

## 2015-12-28 ENCOUNTER — Encounter: Payer: Self-pay | Admitting: Obstetrics and Gynecology

## 2015-12-28 ENCOUNTER — Ambulatory Visit (INDEPENDENT_AMBULATORY_CARE_PROVIDER_SITE_OTHER): Payer: Self-pay | Admitting: Obstetrics and Gynecology

## 2015-12-28 VITALS — BP 104/66 | HR 81 | Temp 98.4°F | Wt 133.9 lb

## 2015-12-28 DIAGNOSIS — Z3492 Encounter for supervision of normal pregnancy, unspecified, second trimester: Secondary | ICD-10-CM

## 2015-12-28 DIAGNOSIS — Z3493 Encounter for supervision of normal pregnancy, unspecified, third trimester: Secondary | ICD-10-CM | POA: Insufficient documentation

## 2015-12-28 DIAGNOSIS — Z3482 Encounter for supervision of other normal pregnancy, second trimester: Secondary | ICD-10-CM

## 2015-12-28 NOTE — Patient Instructions (Addendum)
Follow-up in 4 weeks Anatomy US scheduled For any concerns or emergencies during pregnancy [facility]  Second Trimester of Pregnancy The second trimester is from week 13 through week 28, month 4 through 6. This is often the time in pregnancy that you feel your best. Often times, morning sickness has lessened or quit. You may have more energy, and you may get hungry more often. Your unborn baby (fetus) is growing rapidly. At the end of the sixth month, he or she is about 9 inches long and weighs about 1 pounds. You will likely feel the baby move (quickening) between 18 and 20 weeks of pregnancy. HOME CARE   Avoid all smoking, herbs, and alcohol. Avoid drugs not approved by your doctor.  Do not use any tobacco products, including cigarettes, chewing tobacco, and electronic cigarettes. If you need help quitting, ask your doctor. You may get counseling or other support to help you quit.  Only take medicine as told by your doctor. Some medicines are safe and some are not during pregnancy.  Exercise only as told by your doctor. Stop exercising if you start having cramps.  Eat regular, healthy meals.  Wear a good support bra if your breasts are tender.  Do not use hot tubs, steam rooms, or saunas.  Wear your seat belt when driving.  Avoid raw meat, uncooked cheese, and liter boxes and soil used by cats.  Take your prenatal vitamins.  Take 1500-2000 milligrams of calcium daily starting at the 20th week of pregnancy until you deliver your baby.  Try taking medicine that helps you poop (stool softener) as needed, and if your doctor approves. Eat more fiber by eating fresh fruit, vegetables, and whole grains. Drink enough fluids to keep your pee (urine) clear or pale yellow.  Take warm water baths (sitz baths) to soothe pain or discomfort caused by hemorrhoids. Use hemorrhoid cream if your doctor approves.  If you have puffy, bulging veins (varicose veins), wear support hose.  Raise (elevate) your feet for 15 minutes, 3-4 times a day. Limit salt in your diet.  Avoid heavy lifting, wear low heals, and sit up straight.  Rest with your legs raised if you have leg cramps or low back pain.  Visit your dentist if you have not gone during your pregnancy. Use a soft toothbrush to brush your teeth. Be gentle when you floss.  You can have sex (intercourse) unless your doctor tells you not to.  Go to your doctor visits. GET HELP IF:   You feel dizzy.  You have mild cramps or pressure in your lower belly (abdomen).  You have a nagging pain in your belly area.  You continue to feel sick to your stomach (nauseous), throw up (vomit), or have watery poop (diarrhea).  You have bad smelling fluid coming from your vagina.  You have pain with peeing (urination). GET HELP RIGHT AWAY IF:   You have a fever.  You are leaking fluid from your vagina.  You have spotting or bleeding from your vagina.  You have severe belly cramping or pain.  You lose or gain weight rapidly.  You have trouble catching your breath and have chest pain.  You notice sudden or extreme puffiness (swelling) of your face, hands, ankles, feet, or legs.  You have not felt the baby move in over an hour.  You have severe headaches that do not go away with medicine.  You have vision changes.   This information is not intended to  replace advice given to you by your health care provider. Make sure you discuss any questions you have with your health care provider.   Document Released: 02/20/2010 Document Revised: 12/17/2014 Document Reviewed: 01/27/2013 Elsevier Interactive Patient Education [employer]

## 2015-12-28 NOTE — Progress Notes (Signed)
Kathy Salazar is a 25 y.o. G3P1001 at Unknown for routine follow up.  Patient recently seen in ED on 12/13/15 for UTI. At that time had [place] that estimated EDD as 05/17/16. Ptient did havowever state she had a dating ultrasound on 10/15/15 (verified by [place] pictures she had from [facility]). She reports no bleeding, no contractions, no cramping and no leaking. Endorses still taking PNV.    See flow sheet for details.  A/P: Pregnancy at [redacted]w[redacted]d.  Doing well.  Pregnancy progressing well.  Pregnancy issues include some round ligament pain; otherwise none.  Anatomy scan ordered/scheduled for patient.  Preterm labor precautions reviewed. Discussed emergency care and going to [facility] for emergencies instead of [facility].  Follow up 4 weeks. Handout given.

## 2015-12-30 ENCOUNTER — Ambulatory Visit (HOSPITAL_COMMUNITY)
Admission: RE | Admit: 2015-12-30 | Discharge: 2015-12-30 | Disposition: A | Discharge status: Home / Self Care | Payer: Self-pay | Source: Ambulatory Visit | Attending: Pediatric Surgery | Admitting: Pediatric Surgery

## 2015-12-30 ENCOUNTER — Other Ambulatory Visit: Payer: Self-pay | Admitting: Obstetrics and Gynecology

## 2015-12-30 DIAGNOSIS — Z3A19 19 weeks gestation of pregnancy: Secondary | ICD-10-CM

## 2015-12-30 DIAGNOSIS — Z36 Encounter for antenatal screening of mother: Secondary | ICD-10-CM | POA: Insufficient documentation

## 2015-12-30 DIAGNOSIS — Z3A20 20 weeks gestation of pregnancy: Secondary | ICD-10-CM | POA: Insufficient documentation

## 2015-12-30 DIAGNOSIS — Z3492 Encounter for supervision of normal pregnancy, unspecified, second trimester: Secondary | ICD-10-CM

## 2016-02-07 ENCOUNTER — Ambulatory Visit (INDEPENDENT_AMBULATORY_CARE_PROVIDER_SITE_OTHER): Payer: Self-pay | Admitting: Obstetrics and Gynecology

## 2016-02-07 VITALS — BP 118/70 | HR 82 | Temp 98.2°F | Wt 145.0 lb

## 2016-02-07 DIAGNOSIS — Z3492 Encounter for supervision of normal pregnancy, unspecified, second trimester: Secondary | ICD-10-CM

## 2016-02-07 DIAGNOSIS — Z23 Encounter for immunization: Secondary | ICD-10-CM

## 2016-02-07 NOTE — Patient Instructions (Addendum)
Next appointment needs to be between 3/16-3/23. Please come fasting at next appointment. Meaning no food 2hrs prior to coming in for glucose test. You will be getting lab work and Tdap vaccine also at that visit.   Tdap Vaccine (Tetanus, Diphtheria and Pertussis): What You Need to Know 1. Why get vaccinated? Tetanus, diphtheria and pertussis are very serious diseases. Tdap vaccine can protect [place] from these diseases. And, Tdap vaccine given to pregnant women can protect newborn babies against pertussis. TETANUS (Lockjaw) is rare in the [place] today. It causes painful muscle tightening and stiffness, usually all over the body.  It can lead to tightening of muscles in the head and neck so you can't open your mouth, swallow, or sometimes even breathe. Tetanus kills about 1 out of 10 people who are infected even after receiving the best medical care. DIPHTHERIA is also rare in the [place] today. It can cause a thick coating to form in the back of the throat.  It can lead to breathing problems, heart failure, paralysis, and death. PERTUSSIS (Whooping Cough) causes severe coughing spells, which can cause difficulty breathing, vomiting and disturbed sleep.  It can also lead to weight loss, incontinence, and rib fractures. Up to 2 in 100 adolescents and 5 in 100 adults with pertussis are hospitalized or have complications, which could include pneumonia or death. These diseases are caused by bacteria. Diphtheria and pertussis are spread from person to person through secretions from coughing or sneezing. Tetanus enters the body through cuts, scratches, or wounds. Before vaccines, as many as 200,000 cases of diphtheria, 200,000 cases of pertussis, and hundreds of cases of tetanus, were reported in the [place] each year. Since vaccination began, reports of cases for tetanus and diphtheria have dropped by about 99% and for pertussis by about 80%. 2. Tdap vaccine Tdap vaccine can protect  adolescents and adults from tetanus, diphtheria, and pertussis. One dose of Tdap is routinely given at age 48 or 43. People who did not get Tdap at that age should get it as soon as possible. Tdap is especially important for healthcare professionals and anyone having close contact with a baby younger than 12 months. Pregnant women should get a dose of Tdap during every pregnancy, to protect the newborn from pertussis. Infants are most at risk for severe, life-threatening complications from pertussis. Another vaccine, called Td, protects against tetanus and diphtheria, but not pertussis. A Td booster should be given every 10 years. Tdap may be given as one of these boosters if you have never gotten Tdap before. Tdap may also be given after a severe cut or burn to prevent tetanus infection. Your doctor or the person giving you the vaccine can give you more information. Tdap may safely be given at the same time as other vaccines. 3. Some people should not get this vaccine  A person who has ever had a life-threatening allergic reaction after a previous dose of any diphtheria, tetanus or pertussis containing vaccine, OR has a severe allergy to any part of this vaccine, should not get Tdap vaccine. Tell the person giving the vaccine about any severe allergies.  Anyone who had coma or long repeated seizures within 7 days after a childhood dose of DTP or DTaP, or a previous dose of Tdap, should not get Tdap, unless a cause other than the vaccine was found. They can still get Td.  Talk to your doctor if you:  have seizures or another nervous system problem,  had severe pain  or swelling after any vaccine containing diphtheria, tetanus or pertussis,  ever had a condition called Guillain-Barr Syndrome (GBS),  aren't feeling well on the day the shot is scheduled. 4. Risks With any medicine, including vaccines, there is a chance of side effects. These are usually mild and go away on their own. Serious  reactions are also possible but are rare. Most people who get Tdap vaccine do not have any problems with it. Mild problems following Tdap (Did not interfere with activities)  Pain where the shot was given (about 3 in 4 adolescents or 2 in 3 adults)  Redness or swelling where the shot was given (about 1 person in 5)  Mild fever of at least 100.75F (up to about 1 in 25 adolescents or 1 in 100 adults)  Headache (about 3 or 4 people in 10)  Tiredness (about 1 person in 3 or 4)  Nausea, vomiting, diarrhea, stomach ache (up to 1 in 4 adolescents or 1 in 10 adults)  Chills, sore joints (about 1 person in 10)  Body aches (about 1 person in 3 or 4)  Rash, swollen glands (uncommon) Moderate problems following Tdap (Interfered with activities, but did not require medical attention)  Pain where the shot was given (up to 1 in 5 or 6)  Redness or swelling where the shot was given (up to about 1 in 16 adolescents or 1 in 12 adults)  Fever over 102F (about 1 in 100 adolescents or 1 in 250 adults)  Headache (about 1 in 7 adolescents or 1 in 10 adults)  Nausea, vomiting, diarrhea, stomach ache (up to 1 or 3 people in 100)  Swelling of the entire arm where the shot was given (up to about 1 in 500). Severe problems following Tdap (Unable to perform usual activities; required medical attention)  Swelling, severe pain, bleeding and redness in the arm where the shot was given (rare). Problems that could happen after any vaccine:  People sometimes faint after a medical procedure, including vaccination. Sitting or lying down for about 15 minutes can help prevent fainting, and injuries caused by a fall. Tell your doctor if you feel dizzy, or have vision changes or ringing in the ears.  Some people get severe pain in the shoulder and have difficulty moving the arm where a shot was given. This happens very rarely.  Any medication can cause a severe allergic reaction. Such reactions from a vaccine  are very rare, estimated at fewer than 1 in a million doses, and would happen within a few minutes to a few hours after the vaccination. As with any medicine, there is a very remote chance of a vaccine causing a serious injury or death. The safety of vaccines is always being monitored. For more information, visit: http://floyd.org/ 5. What if there is a serious problem? What should I look for?  Look for anything that concerns you, such as signs of a severe allergic reaction, very high fever, or unusual behavior.  Signs of a severe allergic reaction can include hives, swelling of the face and throat, difficulty breathing, a fast heartbeat, dizziness, and weakness. These would usually start a few minutes to a few hours after the vaccination. What should I do?  If you think it is a severe allergic reaction or other emergency that can't wait, call 9-1-1 or get the person to the nearest hospital. Otherwise, call your doctor.  Afterward, the reaction should be reported to the Vaccine Adverse Event Reporting System (VAERS). Your doctor might file  this report, or you can do it yourself through the VAERS web site at www.vaers.LAgents.no, or by calling 1-3658267771. VAERS does not give medical advice.  6. The National Vaccine Injury Compensation Program The [employer] Vaccine Injury Compensation Program (VICP) is a federal program that was created to compensate people who may have been injured by certain vaccines. Persons who believe they may have been injured by a vaccine can learn about the program and about filing a claim by calling 1-951-887-5125 or visiting the VICP website at SpiritualWord.at. There is a time limit to file a claim for compensation. 7. How can I learn more?  Ask your doctor. He or she can give you the vaccine package insert or suggest other sources of information.  Call your local or state health department.  Contact the Centers for Disease Control and  Prevention (CDC):  Call 854-839-7386 (1-800-CDC-INFO) or  Visit CDC's website at PicCapture.uy CDC Tdap Vaccine VIS (02/02/14)   This information is not intended to replace advice given to you by your health care provider. Make sure you discuss any questions you have with your health care provider.   Document Released: 05/27/2012 Document Revised: 12/17/2014 Document Reviewed: 03/10/2014 Elsevier Interactive Patient Education 2016 Elsevier Inc.   Vaginal Bleeding During Pregnancy, Second Trimester  A small amount of bleeding (spotting) from the vagina is common in pregnancy. Sometimes the bleeding is normal and is not a problem, and sometimes it is a sign of something serious. Be sure to tell your doctor about any bleeding from your vagina right away. HOME CARE  Watch your condition for any changes.  Follow your doctor's instructions about how active you can be.  If you are on bed rest:  You may need to stay in bed and only get up to use the bathroom.  You may be allowed to do some activities.  If you need help, make plans for someone to help you.  Write down:  The number of pads you use each day.  How often you change pads.  How soaked (saturated) your pads are.  Do not use tampons.  Do not douche.  Do not have sex or orgasms until your doctor says it is okay.  If you pass any tissue from your vagina, save the tissue so you can show it to your doctor.  Only take medicines as told by your doctor.  Do not take aspirin because it can make you bleed.  Do not exercise, lift heavy weights, or do any activities that take a lot of energy and effort unless your doctor says it is okay.  Keep all follow-up visits as told by your doctor. GET HELP IF:   You bleed from your vagina.  You have cramps.  You have labor pains.  You have a fever that does not go away after you take medicine. GET HELP RIGHT AWAY IF:  You have very bad cramps in your back or belly  (abdomen).  You have contractions.  You have chills.  You pass large clots or tissue from your vagina.  You bleed more.  You feel light-headed or weak.  You pass out (faint).  You are leaking fluid or have a gush of fluid from your vagina. MAKE SURE YOU:  Understand these instructions.  Will watch your condition.  Will get help right away if you are not doing well or get worse.   This information is not intended to replace advice given to you by your health care provider. Make sure you discuss  any questions you have with your health care provider.   Document Released: 04/12/2014 Document Reviewed: 04/12/2014 Elsevier Interactive Patient Education [employer]

## 2016-02-07 NOTE — Progress Notes (Signed)
Kathy Salazar is a 25 y.o. G3P1001 at [redacted]w[redacted]d for routine follow up. She reports no bleeding, no contractions, no cramping and no leaking. Feeling fetal movement.  See flow sheet for details.  A/P: Pregnancy at [redacted]w[redacted]d.  Doing well.  Pregnancy progressing well.  Pregnancy issues include none.  Need to monitor weight gain. Next visit around 28wks patient will get 28wk labs, 1hr GTT, Tdap.  Normal fetal anatomy scan.  Childbirth and education classes were offered. Handout given for women's hospital classes. Preterm labor precautions reviewed. Follow up in about 3 weeks around 28wk.

## 2016-02-23 ENCOUNTER — Other Ambulatory Visit (INDEPENDENT_AMBULATORY_CARE_PROVIDER_SITE_OTHER): Payer: Self-pay

## 2016-02-23 DIAGNOSIS — Z3482 Encounter for supervision of other normal pregnancy, second trimester: Secondary | ICD-10-CM

## 2016-02-23 DIAGNOSIS — Z3492 Encounter for supervision of normal pregnancy, unspecified, second trimester: Secondary | ICD-10-CM

## 2016-02-23 LAB — GLUCOSE, CAPILLARY
Comment 1: 1
Glucose-Capillary: 136 mg/dL — ABNORMAL HIGH (ref 65–99)

## 2016-02-23 NOTE — Progress Notes (Signed)
Patient scheduled for 3 hr gtt tomorrow. Busick, Rodena Medinobert Lee

## 2016-02-24 ENCOUNTER — Other Ambulatory Visit (INDEPENDENT_AMBULATORY_CARE_PROVIDER_SITE_OTHER): Payer: Self-pay

## 2016-02-24 DIAGNOSIS — Z3482 Encounter for supervision of other normal pregnancy, second trimester: Secondary | ICD-10-CM

## 2016-02-24 DIAGNOSIS — Z3492 Encounter for supervision of normal pregnancy, unspecified, second trimester: Secondary | ICD-10-CM

## 2016-02-24 LAB — GLUCOSE TOLERANCE, 3 HOURS
Glucose Tolerance, 1 hour: 149 mg/dL (ref <190)
Glucose Tolerance, 2 hour: 120 mg/dL (ref <165)
Glucose Tolerance, Fasting: 78 mg/dL (ref 65–104)
Glucose, GTT - 3 Hour: 86 mg/dL (ref <145)

## 2016-02-24 LAB — GLUCOSE, CAPILLARY: Glucose-Capillary: 80 mg/dL (ref 65–99)

## 2016-02-24 NOTE — Progress Notes (Signed)
3 hr gtt done today Kathy Salazar 

## 2016-02-28 ENCOUNTER — Ambulatory Visit (INDEPENDENT_AMBULATORY_CARE_PROVIDER_SITE_OTHER): Payer: Self-pay | Admitting: *Deleted

## 2016-02-28 DIAGNOSIS — Z23 Encounter for immunization: Secondary | ICD-10-CM

## 2016-02-28 NOTE — Progress Notes (Signed)
    Kathy Salazar presents for immunizations.    Screening questions for immunizations: 1. Are you sick today?  no 2. Do you have allergies to medications, foods, or any vaccines?  no 3. Have you ever had a serious reaction after receiving a vaccination?  no 4. Do you have a long-term health problem with heart disease, asthma, lung disease, kidney disease, metabolic disease (e.g. diabetes), anemia, or other blood disorder?  no 5. Have you had a seizure, brain problem, or other nervous system problem?  no 6. Do you have cancer, leukemia, AIDS, or any other immune system problem?  no 7. Do you take cortisone, prednisone, other steroids, anticancer drugs or have you had radiation treatments?  no 8. Have you received a transfusion of blood or blood products, or been given immune (gamma) globulin or an antiviral drug in the past year?  no 9. Have you received vaccinations in the past 4 weeks?  no 10. FEMALES ONLY: Are you pregnant or is there a chance you could become pregnant during the next month?  yes   Clovis PuMartin, Tamika L, RN

## 2016-03-02 ENCOUNTER — Ambulatory Visit (INDEPENDENT_AMBULATORY_CARE_PROVIDER_SITE_OTHER): Payer: Self-pay | Admitting: Obstetrics and Gynecology

## 2016-03-02 VITALS — BP 115/75 | HR 96 | Temp 98.2°F | Wt 148.3 lb

## 2016-03-02 DIAGNOSIS — Z3492 Encounter for supervision of normal pregnancy, unspecified, second trimester: Secondary | ICD-10-CM

## 2016-03-02 DIAGNOSIS — Z3482 Encounter for supervision of other normal pregnancy, second trimester: Secondary | ICD-10-CM

## 2016-03-02 LAB — CBC
HCT: 35.3 % — ABNORMAL LOW (ref 36.0–46.0)
Hemoglobin: 11.9 g/dL — ABNORMAL LOW (ref 12.0–15.0)
MCH: 29 pg (ref 26.0–34.0)
MCHC: 33.7 g/dL (ref 30.0–36.0)
MCV: 86.1 fL (ref 78.0–100.0)
MPV: 11.8 fL (ref 8.6–12.4)
Platelets: 135 10*3/uL — ABNORMAL LOW (ref 150–400)
RBC: 4.1 MIL/uL (ref 3.87–5.11)
RDW: 12.3 % (ref 11.5–15.5)
WBC: 7.1 10*3/uL (ref 4.0–10.5)

## 2016-03-02 LAB — HIV ANTIBODY (ROUTINE TESTING W REFLEX): HIV 1&2 Ab, 4th Generation: NONREACTIVE

## 2016-03-02 NOTE — Progress Notes (Signed)
Lissa HoardSonia Perez-Tinoco is a 25 y.o. G3P1001 at 5437w1d for routine follow up.  She reports some left sided lower abdominal pain that is worse with movement in and out of bed. "Feels like something is stretching."   See flow sheet for details.  A/P: Pregnancy at 7437w1d.  Doing well.   Pregnancy issues include: None.   Infant feeding choice Bottle Contraception choice OCPs Infant circumcision desired not applicable  Tdap already given. Glucola 3-hr results reviewed; normal. No signs of GDM. CBC, RPR, and HIV were done today.   RH status was reviewed and pt does not need Rhogam.   Childbirth and education classes were offered again. Discussed round ligament pain and handout given.  Preterm labor precautions reviewed. Kick counts reviewed. Follow up 3 weeks.  Caryl AdaJazma Katti Pelle, DO 03/02/2016, 10:12 AM PGY-2, [facility]

## 2016-03-02 NOTE — Patient Instructions (Signed)
Round Ligament Pain °The round ligament is a cord of muscle and tissue that helps to support the uterus. It can become a source of pain during pregnancy if it becomes stretched or twisted as the baby grows. The pain usually begins in the second trimester of pregnancy, and it can come and go until the baby is delivered. It is not a serious problem, and it does not cause harm to the baby. °Round ligament pain is usually a short, sharp, and pinching pain, but it can also be a dull, lingering, and aching pain. The pain is felt in the lower side of the abdomen or in the groin. It usually starts deep in the groin and moves up to the outside of the hip area. Pain can occur with: °· A sudden change in position. °· Rolling over in bed. °· Coughing or sneezing. °· Physical activity. °HOME CARE INSTRUCTIONS °Watch your condition for any changes. Take these steps to help with your pain: °· When the pain starts, relax. Then try: °¨ Sitting down. °¨ Flexing your knees up to your abdomen. °¨ Lying on your side with one pillow under your abdomen and another pillow between your legs. °¨ Sitting in a warm bath for 15-20 minutes or until the pain goes away. °· Take over-the-counter and prescription medicines only as told by your health care provider. °· Move slowly when you sit and stand. °· Avoid long walks if they cause pain. °· Stop or lessen your physical activities if they cause pain. °SEEK MEDICAL CARE IF: °· Your pain does not go away with treatment. °· You feel pain in your back that you did not have before. °· Your medicine is not helping. °SEEK IMMEDIATE MEDICAL CARE IF: °· You develop a fever or chills. °· You develop uterine contractions. °· You develop vaginal bleeding. °· You develop nausea or vomiting. °· You develop diarrhea. °· You have pain when you urinate. °  °This information is not intended to replace advice given to you by your health care provider. Make sure you discuss any questions you have with your health  care provider. °  °Document Released: 09/04/2008 Document Revised: 02/18/2012 Document Reviewed: 02/02/2015 °Elsevier Interactive Patient Education ©2016 Elsevier Inc. °Preterm Labor Information °Preterm labor is when labor starts before you are [redacted] weeks pregnant. The normal length of pregnancy is 39 to 41 weeks.  °CAUSES  °The cause of preterm labor is not often known. The most common known cause is infection. °RISK FACTORS °· Having a history of preterm labor. °· Having your water break before it should. °· Having a placenta that covers the opening of the cervix. °· Having a placenta that breaks away from the uterus. °· Having a cervix that is too weak to hold the baby in the uterus. °· Having too much fluid in the amniotic sac. °· Taking drugs or smoking while pregnant. °· Not gaining enough weight while pregnant. °· Being younger than 18 and older than 27 years old. °· Having a low income. °· Being African American. °SYMPTOMS °· Period-like cramps, belly (abdominal) pain, or back pain. °· Contractions that are regular, as often as six in an hour. They may be mild or painful. °· Contractions that start at the top of the belly. They then move to the lower belly and back. °· Lower belly pressure that seems to get stronger. °· Bleeding from the vagina. °· Fluid leaking from the vagina. °TREATMENT  °Treatment depends on: °· Your condition. °· The condition of your   baby. °· How many weeks pregnant you are. °Your doctor may have you: °· Take medicine to stop contractions. °· Stay in bed except to use the restroom (bed rest). °· Stay in the hospital. °WHAT SHOULD YOU DO IF YOU THINK YOU ARE IN PRETERM LABOR? °Call your doctor right away. You need to go to the hospital right away.  °HOW CAN YOU PREVENT PRETERM LABOR IN FUTURE PREGNANCIES? °· Stop smoking, if you smoke. °· Maintain healthy weight gain. °· Do not take drugs or be around chemicals that are not needed. °· Tell your doctor if you think you have an  infection. °· Tell your doctor if you had a preterm labor before. °  °This information is not intended to replace advice given to you by your health care provider. Make sure you discuss any questions you have with your health care provider. °  °Document Released: 02/22/2009 Document Revised: 04/12/2015 Document Reviewed: 12/29/2012 °Elsevier Interactive Patient Education ©2016 Elsevier Inc. ° °

## 2016-03-03 LAB — RPR

## 2016-03-30 ENCOUNTER — Ambulatory Visit (INDEPENDENT_AMBULATORY_CARE_PROVIDER_SITE_OTHER): Payer: Self-pay | Admitting: Internal Medicine

## 2016-03-30 ENCOUNTER — Encounter: Payer: Self-pay | Admitting: Internal Medicine

## 2016-03-30 VITALS — BP 106/72 | Temp 98.1°F | Wt 158.0 lb

## 2016-03-30 DIAGNOSIS — Z3483 Encounter for supervision of other normal pregnancy, third trimester: Secondary | ICD-10-CM

## 2016-03-30 NOTE — Progress Notes (Signed)
Kathy HoardSonia Salazar is a 25 y.o. G3P1011 at 3019w1d for routine follow up.  She reports she is doing well and has no concerns  See flow sheet for details.  On ROS, patient reports of intermittent headaches for the past 3 weeks. About 2 x a week on average. No visual changes. Reports that sometimes bilateral eye discomfort when she looks at digital clocks; thinks the neon green background is what bothers her. This does not occur consistently. Had similar symptoms during first trimester which self resolved. No RUQ abdominal pain. No vaginal bleeding, LOF, or vaginal discharge. Endorses fetal movements.   A/P: Pregnancy at 10619w1d.  Doing well.   Pregnancy issues include none.   Infant feeding choice: formula Contraception choice: Pills  Infant circumcision desired; N/A  Tdap: already received Tdap during this pregnancy  GBS/GC/CZ testing was not performed today.  Preterm labor precautions reviewed. Safe sleep discussed. Kick counts reviewed. Headaches do not seem concerning currently as they self-resolve, no visual disturbances, and normal blood pressure. Return precautions discussed with patient [facility] with Dr. Pollie MeyerMcIntyre or Dr. Lum BabeEniola

## 2016-03-30 NOTE — Patient Instructions (Signed)
Please make your next follow up visit in [facility] with Dr. Pollie MeyerMcIntyre or Dr. Lum BabeEniola.    Preterm Labor Information Preterm labor is when labor starts at less than 37 weeks of pregnancy. The normal length of a pregnancy is 39 to 41 weeks. CAUSES Often, there is no identifiable underlying cause as to why a woman goes into preterm labor. One of the most common known causes of preterm labor is infection. Infections of the uterus, cervix, vagina, amniotic sac, bladder, kidney, or even the lungs (pneumonia) can cause labor to start. Other suspected causes of preterm labor include:   Urogenital infections, such as yeast infections and bacterial vaginosis.   Uterine abnormalities (uterine shape, uterine septum, fibroids, or bleeding from the placenta).   A cervix that has been operated on (it may fail to stay closed).   Malformations in the fetus.   Multiple gestations (twins, triplets, and so on).   Breakage of the amniotic sac.  RISK FACTORS  Having a previous history of preterm labor.   Having premature rupture of membranes (PROM).   Having a placenta that covers the opening of the cervix (placenta previa).   Having a placenta that separates from the uterus (placental abruption).   Having a cervix that is too weak to hold the fetus in the uterus (incompetent cervix).   Having too much fluid in the amniotic sac (polyhydramnios).   Taking illegal drugs or smoking while pregnant.   Not gaining enough weight while pregnant.   Being younger than 8518 and older than 27 years old.   Having a low socioeconomic status.   Being African American. SYMPTOMS Signs and symptoms of preterm labor include:   Menstrual-like cramps, abdominal pain, or back pain.  Uterine contractions that are regular, as frequent as six in an hour, regardless of their intensity (may be mild or painful).  Contractions that start on the top of the uterus and spread down to the lower  abdomen and back.   A sense of increased pelvic pressure.   A watery or bloody mucus discharge that comes from the vagina.  TREATMENT Depending on the length of the pregnancy and other circumstances, your health care provider may suggest bed rest. If necessary, there are medicines that can be given to stop contractions and to mature the fetal lungs. If labor happens before 34 weeks of pregnancy, a prolonged hospital stay may be recommended. Treatment depends on the condition of both you and the fetus.  WHAT SHOULD YOU DO IF YOU THINK YOU ARE IN PRETERM LABOR? Call your health care provider right away. You will need to go to the hospital to get checked immediately. HOW CAN YOU PREVENT PRETERM LABOR IN FUTURE PREGNANCIES? You should:   Stop smoking if you smoke.  Maintain healthy weight gain and avoid chemicals and drugs that are not necessary.  Be watchful for any type of infection.  Inform your health care provider if you have a known history of preterm labor.   This information is not intended to replace advice given to you by your health care provider. Make sure you discuss any questions you have with your health care provider.   Document Released: 02/16/2004 Document Revised: 07/29/2013 Document Reviewed: 12/29/2012 Elsevier Interactive Patient Education [employer]2016 Elsevier Inc.

## 2016-04-05 ENCOUNTER — Ambulatory Visit (INDEPENDENT_AMBULATORY_CARE_PROVIDER_SITE_OTHER): Payer: Self-pay | Admitting: Family Medicine

## 2016-04-05 ENCOUNTER — Encounter: Payer: Self-pay | Admitting: Family Medicine

## 2016-04-05 VITALS — BP 109/70 | HR 104 | Temp 98.0°F | Wt 157.9 lb

## 2016-04-05 DIAGNOSIS — Z3483 Encounter for supervision of other normal pregnancy, third trimester: Secondary | ICD-10-CM

## 2016-04-05 NOTE — Patient Instructions (Signed)
If you have any regularly occuring cramping/contractions, vaginal bleeding, fluid leaking, or are worried that baby is not moving normally, go immediately to [facility] to be evaluated.    Third Trimester of Pregnancy The third trimester is from week 29 through week 42, months 7 through 9. The third trimester is a time when the fetus is growing rapidly. At the end of the ninth month, the fetus is about 20 inches in length and weighs 6-10 pounds.  BODY CHANGES Your body goes through many changes during pregnancy. The changes vary from woman to woman.   Your weight will continue to increase. You can expect to gain 25-35 pounds (11-16 kg) by the end of the pregnancy.  You may begin to get stretch marks on your hips, abdomen, and breasts.  You may urinate more often because the fetus is moving lower into your pelvis and pressing on your bladder.  You may develop or continue to have heartburn as a result of your pregnancy.  You may develop constipation because certain hormones are causing the muscles that push waste through your intestines to slow down.  You may develop hemorrhoids or swollen, bulging veins (varicose veins).  You may have pelvic pain because of the weight gain and pregnancy hormones relaxing your joints between the bones in your pelvis. Backaches may result from overexertion of the muscles supporting your posture.  You may have changes in your hair. These can include thickening of your hair, rapid growth, and changes in texture. Some women also have hair loss during or after pregnancy, or hair that feels dry or thin. Your hair will most likely return to normal after your baby is born.  Your breasts will continue to grow and be tender. A yellow discharge may leak from your breasts called colostrum.  Your belly button may stick out.  You may feel short of breath because of your expanding uterus.  You may notice the fetus "dropping," or moving lower in your  abdomen.  You may have a bloody mucus discharge. This usually occurs a few days to a week before labor begins.  Your cervix becomes thin and soft (effaced) near your due date. WHAT TO EXPECT AT YOUR PRENATAL EXAMS  You will have prenatal exams every 2 weeks until week 36. Then, you will have weekly prenatal exams. During a routine prenatal visit:  You will be weighed to make sure you and the fetus are growing normally.  Your blood pressure is taken.  Your abdomen will be measured to track your baby's growth.  The fetal heartbeat will be listened to.  Any test results from the previous visit will be discussed.  You may have a cervical check near your due date to see if you have effaced. At around 36 weeks, your caregiver will check your cervix. At the same time, your caregiver will also perform a test on the secretions of the vaginal tissue. This test is to determine if a type of bacteria, Group B streptococcus, is present. Your caregiver will explain this further. Your caregiver may ask you:  What your birth plan is.  How you are feeling.  If you are feeling the baby move.  If you have had any abnormal symptoms, such as leaking fluid, bleeding, severe headaches, or abdominal cramping.  If you are using any tobacco products, including cigarettes, chewing tobacco, and electronic cigarettes.  If you have any questions. Other tests or screenings that may be performed during your third trimester include:  Blood tests that  check for low iron levels (anemia).  Fetal testing to check the health, activity level, and growth of the fetus. Testing is done if you have certain medical conditions or if there are problems during the pregnancy.  HIV (human immunodeficiency virus) testing. If you are at high risk, you may be screened for HIV during your third trimester of pregnancy. FALSE LABOR You may feel small, irregular contractions that eventually go away. These are called Braxton Hicks  contractions, or false labor. Contractions may last for hours, days, or even weeks before true labor sets in. If contractions come at regular intervals, intensify, or become painful, it is best to be seen by your caregiver.  SIGNS OF LABOR   Menstrual-like cramps.  Contractions that are 5 minutes apart or less.  Contractions that start on the top of the uterus and spread down to the lower abdomen and back.  A sense of increased pelvic pressure or back pain.  A watery or bloody mucus discharge that comes from the vagina. If you have any of these signs before the 37th week of pregnancy, call your caregiver right away. You need to go to the hospital to get checked immediately. HOME CARE INSTRUCTIONS   Avoid all smoking, herbs, alcohol, and unprescribed drugs. These chemicals affect the formation and growth of the baby.  Do not use any tobacco products, including cigarettes, chewing tobacco, and electronic cigarettes. If you need help quitting, ask your health care provider. You may receive counseling support and other resources to help you quit.  Follow your caregiver's instructions regarding medicine use. There are medicines that are either safe or unsafe to take during pregnancy.  Exercise only as directed by your caregiver. Experiencing uterine cramps is a good sign to stop exercising.  Continue to eat regular, healthy meals.  Wear a good support bra for breast tenderness.  Do not use hot tubs, steam rooms, or saunas.  Wear your seat belt at all times when driving.  Avoid raw meat, uncooked cheese, cat litter boxes, and soil used by cats. These carry germs that can cause birth defects in the baby.  Take your prenatal vitamins.  Take 1500-2000 mg of calcium daily starting at the 20th week of pregnancy until you deliver your baby.  Try taking a stool softener (if your caregiver approves) if you develop constipation. Eat more high-fiber foods, such as fresh vegetables or fruit and  whole grains. Drink plenty of fluids to keep your urine clear or pale yellow.  Take warm sitz baths to soothe any pain or discomfort caused by hemorrhoids. Use hemorrhoid cream if your caregiver approves.  If you develop varicose veins, wear support hose. Elevate your feet for 15 minutes, 3-4 times a day. Limit salt in your diet.  Avoid heavy lifting, wear low heal shoes, and practice good posture.  Rest a lot with your legs elevated if you have leg cramps or low back pain.  Visit your dentist if you have not gone during your pregnancy. Use a soft toothbrush to brush your teeth and be gentle when you floss.  A sexual relationship may be continued unless your caregiver directs you otherwise.  Do not travel far distances unless it is absolutely necessary and only with the approval of your caregiver.  Take prenatal classes to understand, practice, and ask questions about the labor and delivery.  Make a trial run to the hospital.  Pack your hospital bag.  Prepare the baby's nursery.  Continue to go to all your prenatal visits  as directed by your caregiver. SEEK MEDICAL CARE IF:  You are unsure if you are in labor or if your water has broken.  You have dizziness.  You have mild pelvic cramps, pelvic pressure, or nagging pain in your abdominal area.  You have persistent nausea, vomiting, or diarrhea.  You have a bad smelling vaginal discharge.  You have pain with urination. SEEK IMMEDIATE MEDICAL CARE IF:   You have a fever.  You are leaking fluid from your vagina.  You have spotting or bleeding from your vagina.  You have severe abdominal cramping or pain.  You have rapid weight loss or gain.  You have shortness of breath with chest pain.  You notice sudden or extreme swelling of your face, hands, ankles, feet, or legs.  You have not felt your baby move in over an hour.  You have severe headaches that do not go away with medicine.  You have vision changes.    This information is not intended to replace advice given to you by your health care provider. Make sure you discuss any questions you have with your health care provider.   Document Released: 11/20/2001 Document Revised: 12/17/2014 Document Reviewed: 01/27/2013 Elsevier Interactive Patient Education [employer]2016 Elsevier Inc.

## 2016-04-08 ENCOUNTER — Encounter: Payer: Self-pay | Admitting: Family Medicine

## 2016-04-08 NOTE — Progress Notes (Signed)
[facility] Visit  Kathy Salazar is a 25 y.o. G3P1011 at 3658w4d here for routine follow up.  She reports she's doing well. Has some occasional ctx, nothing regular or lasting long. Denies vaginal bleeding, leaking of fluid, or decreased fetal movement. Declines pelvic exam to check cervix today. See flow sheet for details.  Pediatrician - unsure. [facility] [employer]. Might want to switch to another Contraception - depo at first, then will switch to pills Circumcision - n/a - having a girl Feeding - bottle. Tried breastfeeding with first child, unsuccessful. Reviewed benefits of breastfeeding today. Tdap - got Tdap shot already  Reviewed dating criteria and prior pregnancy history with patient today.  Has had one prior vaginal delivery, which was vacuum assisted with episiotomy due to fetal tachycardia with variable decels. Also required amp/gent in labor due to maternal fever.  Prior first trimester failed pregnancy. Updated this history in Epic.  A/P: Pregnancy at 6158w4d.  Doing well.   Pregnancy issues include: - Incorrect dating. Previously dated by brief ultrasound in ED on 12/13/15 which estimated GA at 3155w5d but was ONLY based on biparietal diameter, no other markers used to assess gestational age. Subsequent anatomy scan on 12/30/15 dated pregnancy at 3414w5d, giving EDC of 05/20/16. This is the correct EDC. Dating criteria updated. Confirmed via phone with Dr. Erin FullingHarraway-Smith that this is the correct EDC to use. -UTI & BV, treated with keflex & flagyl. Symptoms have resolved per patient. Unfortunately no urine culture available from UTI.  Preterm labor and fetal movement precautions reviewed. Follow up 2 weeks.

## 2016-04-16 ENCOUNTER — Telehealth: Payer: Self-pay | Admitting: Obstetrics and Gynecology

## 2016-04-16 NOTE — Telephone Encounter (Signed)
Patient is concerned because she is [redacted] weeks pregnant and she bleed twice today when she went to the bathroom. Please, follow up.

## 2016-04-16 NOTE — Telephone Encounter (Signed)
Left message for pt to return my call to get more information. Page, cma.

## 2016-04-19 ENCOUNTER — Ambulatory Visit (INDEPENDENT_AMBULATORY_CARE_PROVIDER_SITE_OTHER): Payer: Self-pay | Admitting: Student

## 2016-04-19 VITALS — BP 108/72 | HR 101 | Wt 163.0 lb

## 2016-04-19 DIAGNOSIS — Z331 Pregnant state, incidental: Secondary | ICD-10-CM

## 2016-04-19 DIAGNOSIS — Z3490 Encounter for supervision of normal pregnancy, unspecified, unspecified trimester: Secondary | ICD-10-CM

## 2016-04-19 NOTE — Progress Notes (Signed)
Return OB at 7071w4d ( U=19) S: denies regular contractions, loss of fluid, vaginal bleeding, reports + fetal movement. Denies vaginal irritation, discharge, dysuria O: See tab  A/P IUP at 35/4, doing well Routine OB: needs GBS at next visit, continue PNV, s/p Tdap vaccine Post partum: girl, undecided about breast feeding, originally considering depo vs OCPs for contraception. Discussed LARC methods and she is interested in [place]. Continue to discuss  Labor precautions reviewed Follow up in 1 week  Mikel Hardgrove A. Kennon RoundsHaney MD, MS Family Medicine Resident PGY-2 Pager [phone]

## 2016-04-19 NOTE — Patient Instructions (Signed)
Follow up in 1 week If you develop vaginal bleeding, regular contractions, loss of fluid like you broke your water [facility]. If you feel baby is moving less, sit ina quiet place and count fetal movement sfor 2 hours. You want at least 10 movements in 2 hours If you have questions or concerns call the office at [phone]

## 2016-04-27 ENCOUNTER — Other Ambulatory Visit (HOSPITAL_COMMUNITY): Admission: RE | Admit: 2016-04-27 | Payer: Self-pay | Source: Ambulatory Visit | Admitting: Family Medicine

## 2016-04-27 ENCOUNTER — Ambulatory Visit (INDEPENDENT_AMBULATORY_CARE_PROVIDER_SITE_OTHER): Payer: Self-pay | Admitting: Obstetrics and Gynecology

## 2016-04-27 VITALS — BP 103/64 | HR 92 | Temp 98.5°F | Wt 164.6 lb

## 2016-04-27 DIAGNOSIS — Z3483 Encounter for supervision of other normal pregnancy, third trimester: Secondary | ICD-10-CM

## 2016-04-27 DIAGNOSIS — Z3493 Encounter for supervision of normal pregnancy, unspecified, third trimester: Secondary | ICD-10-CM

## 2016-04-27 NOTE — Addendum Note (Signed)
Addended by: Gilberto BetterSIMPSON, MICHELLE R on: 04/27/2016 09:37 AM   Modules accepted: Orders

## 2016-04-27 NOTE — Progress Notes (Signed)
Kathy Salazar is a 25 y.o. G3P1011 at 6372w5d for routine follow up.  She reports no concerns today. See flow sheet for details.  A/P: Pregnancy at 4072w5d.  Doing well.   Pregnancy issues include None.   1. Supervision of normal pregnancy in third trimester GBS/GC/CZ testing was performed today.  Labor precautions reviewed. Safe sleep discussed. Kick counts reviewed. Follow up 1 week.  Kathy AdaJazma Gordy Goar, DO 04/27/2016, 9:29 AM PGY-2, [facility]

## 2016-04-27 NOTE — Patient Instructions (Signed)
Follow-up in 1 week Will let you know results of labs when I receive them Go to MAU if you have vaginal bleeding, LOF, decreased fetal movement, or go into labor  Third Trimester of Pregnancy The third trimester is from week 29 through week 42, months 7 through 9. This trimester is when your unborn baby (fetus) is growing very fast. At the end of the ninth month, the unborn baby is about 20 inches in length. It weighs about 6-10 pounds.  HOME CARE   Avoid all smoking, herbs, and alcohol. Avoid drugs not approved by your doctor.  Do not use any tobacco products, including cigarettes, chewing tobacco, and electronic cigarettes. If you need help quitting, ask your doctor. You may get counseling or other support to help you quit.  Only take medicine as told by your doctor. Some medicines are safe and some are not during pregnancy.  Exercise only as told by your doctor. Stop exercising if you start having cramps.  Eat regular, healthy meals.  Wear a good support bra if your breasts are tender.  Do not use hot tubs, steam rooms, or saunas.  Wear your seat belt when driving.  Avoid raw meat, uncooked cheese, and liter boxes and soil used by cats.  Take your prenatal vitamins.  Take 1500-2000 milligrams of calcium daily starting at the 20th week of pregnancy until you deliver your baby.  Try taking medicine that helps you poop (stool softener) as needed, and if your doctor approves. Eat more fiber by eating fresh fruit, vegetables, and whole grains. Drink enough fluids to keep your pee (urine) clear or pale yellow.  Take warm water baths (sitz baths) to soothe pain or discomfort caused by hemorrhoids. Use hemorrhoid cream if your doctor approves.  If you have puffy, bulging veins (varicose veins), wear support hose. Raise (elevate) your feet for 15 minutes, 3-4 times a day. Limit salt in your diet.  Avoid heavy lifting, wear low heels, and sit up straight.  Rest with your legs raised if  you have leg cramps or low back pain.  Visit your dentist if you have not gone during your pregnancy. Use a soft toothbrush to brush your teeth. Be gentle when you floss.  You can have sex (intercourse) unless your doctor tells you not to.  Do not travel far distances unless you must. Only do so with your doctor's approval.  Take prenatal classes.  Practice driving to the hospital.  Pack your hospital bag.  Prepare the baby's room.  Go to your doctor visits. GET HELP IF:  You are not sure if you are in labor or if your water has broken.  You are dizzy.  You have mild cramps or pressure in your lower belly (abdominal).  You have a nagging pain in your belly area.  You continue to feel sick to your stomach (nauseous), throw up (vomit), or have watery poop (diarrhea).  You have bad smelling fluid coming from your vagina.  You have pain with peeing (urination). GET HELP RIGHT AWAY IF:   You have a fever.  You are leaking fluid from your vagina.  You are spotting or bleeding from your vagina.  You have severe belly cramping or pain.  You lose or gain weight rapidly.  You have trouble catching your breath and have chest pain.  You notice sudden or extreme puffiness (swelling) of your face, hands, ankles, feet, or legs.  You have not felt the baby move in over an hour.  You  have severe headaches that do not go away with medicine.  You have vision changes.   This information is not intended to replace advice given to you by your health care provider. Make sure you discuss any questions you have with your health care provider.   Document Released: 02/20/2010 Document Revised: 12/17/2014 Document Reviewed: 01/27/2013 Elsevier Interactive Patient Education [employer]2016 [employer] Inc.

## 2016-04-27 NOTE — Addendum Note (Signed)
Addended by: Jennette BillBUSICK, ROBERT L on: 04/27/2016 10:09 AM   Modules accepted: Orders

## 2016-04-28 LAB — STREP B DNA PROBE
GBSP: DETECTED
GBSP: DETECTED

## 2016-04-30 ENCOUNTER — Encounter: Payer: Self-pay | Admitting: Family Medicine

## 2016-04-30 LAB — CERVICOVAGINAL ANCILLARY ONLY
Chlamydia: NEGATIVE
Neisseria Gonorrhea: NEGATIVE

## 2016-04-30 NOTE — Progress Notes (Signed)
Discussed pregnancy dating with PCP Dr. Doroteo GlassmanPhelps.  It turns out patient DID have early dating ultrasound On 10/15/15 was estimated at 4872w0d [facility]  This is overall concordant with subsequent ultrasounds: 12/13/15 ED brief scan with [facility] of 05/17/16 via BPD alone [facility]  Will again update dating to reflect correct [facility] of 05/19/16 (only one day off from present estimate).  Kathy FeinsteinBrittany Dalicia Kisner, MD [facility]

## 2016-05-04 ENCOUNTER — Encounter: Payer: Self-pay | Admitting: Student

## 2016-05-04 ENCOUNTER — Ambulatory Visit (INDEPENDENT_AMBULATORY_CARE_PROVIDER_SITE_OTHER): Payer: Self-pay | Admitting: Student

## 2016-05-04 VITALS — BP 116/71 | HR 91 | Temp 98.1°F | Ht 63.0 in | Wt 168.0 lb

## 2016-05-04 DIAGNOSIS — O0001 Abdominal pregnancy with intrauterine pregnancy: Secondary | ICD-10-CM

## 2016-05-04 NOTE — Progress Notes (Signed)
25 y/o G3P1o11 at 37/6 (U=9) Preg uncomplicated S: reports irregular contractions, denies loss of fluid, vaginal bleeding, reports + fetal movement  O: See tab  A/P IUP at 37/6 doing well Routine OB: GBS+, will neen abx in labor PP plans: girl, considering breast ( counseling given), considering Mirena vs OCP  RTC 1 week  Labor precautions reviewed  Alyssa A. Kennon RoundsHaney MD, MS Family Medicine Resident PGY-2 Pager [phone]

## 2016-05-04 NOTE — Patient Instructions (Signed)
Follow up in 1 week for prenatal visit If you have regular contractions, vaginal bleeding like a period, loss of fluid like you broke your water , [facility]. If you feel your baby is moving less, sit ina quiet place and count baby's movement. You want at least 10 movements in 2 hours

## 2016-05-08 ENCOUNTER — Ambulatory Visit (INDEPENDENT_AMBULATORY_CARE_PROVIDER_SITE_OTHER): Payer: Self-pay | Admitting: Family Medicine

## 2016-05-08 VITALS — BP 106/64 | HR 100 | Temp 98.0°F | Wt 169.4 lb

## 2016-05-08 DIAGNOSIS — O26893 Other specified pregnancy related conditions, third trimester: Secondary | ICD-10-CM

## 2016-05-08 DIAGNOSIS — N949 Unspecified condition associated with female genital organs and menstrual cycle: Secondary | ICD-10-CM

## 2016-05-08 LAB — POCT UA - MICROSCOPIC ONLY

## 2016-05-08 LAB — POCT URINALYSIS DIPSTICK
Bilirubin, UA: NEGATIVE
Blood, UA: NEGATIVE
Glucose, UA: NEGATIVE
Ketones, UA: NEGATIVE
Nitrite, UA: NEGATIVE
Protein, UA: NEGATIVE
Spec Grav, UA: 1.01
Urobilinogen, UA: 0.2
pH, UA: 6

## 2016-05-08 NOTE — Progress Notes (Signed)
    Subjective   Kathy HoardSonia Salazar is a 25 y.o. female that presents for a same day visit  1. Pelvic pain: Symptoms started last night. She descibes the pain as pressure. She reports her pain is similar to a previous UTI, however she is not having any dysuria. Pain is worse when lying down. It improves with standing but worsens with walking. She reports no further urinary frequency. No fevers, nausea or vomiting. Urinating alleviates symptoms. She reports a change in vaginal discharge with no leaking of fluid or vaginal bleeding. She reports good fetal movement  ROS Per HPI  Social History  Substance Use Topics  . Smoking status: Never Smoker   . Smokeless tobacco: Not on file  . Alcohol Use: No    No Known Allergies  Objective   BP 106/64 mmHg  Pulse 100  Temp(Src) 98 F (36.7 C) (Oral)  Wt 169 lb 6.4 oz (76.839 kg)  LMP  (LMP Unknown)  General: Well appearing, no distress Gastrointestinal: Soft, gravid, non-tender  Assessment and Plan   1. Pelvic pressure in pregnancy, third trimester Patient with unknown cause of pelvic pressure. Urine not suggestive of a UTI. With change in discharge, possible vaginal infection. Patient declined pelvic exam. Discussed red flags [facility] including, vaginal discharge, bleeding, leaking of fluid or decreased fetal movement. - Urinalysis Dipstick - POCT UA - Microscopic Only - Urine culture

## 2016-05-08 NOTE — Patient Instructions (Signed)
Thank you for coming to see me today. It was a pleasure. Today we talked about:   Your urine test was negative for infection. This could be related to round ligament pain. Since you're having discharge, we discussed getting a wet prep, but he decided not to do this at this time. I talked able return precautions. I will get a urine culture and you'll be informed if there are positive results. If you have any vaginal bleeding, leaking of fluid, decreased fetal movement, increased discharge, please don't to [facility].  If you have any questions or concerns, please do not hesitate to call the office at [phone].  Sincerely,  Jacquelin Hawkingalph Neha Waight, MD

## 2016-05-10 ENCOUNTER — Encounter: Payer: Self-pay | Admitting: Family Medicine

## 2016-05-10 LAB — URINE CULTURE
Colony Count: NO GROWTH
Organism ID, Bacteria: NO GROWTH

## 2016-05-11 ENCOUNTER — Ambulatory Visit (INDEPENDENT_AMBULATORY_CARE_PROVIDER_SITE_OTHER): Payer: Self-pay | Admitting: Internal Medicine

## 2016-05-11 VITALS — BP 116/74 | HR 95 | Temp 98.1°F | Wt 171.4 lb

## 2016-05-11 DIAGNOSIS — Z3493 Encounter for supervision of normal pregnancy, unspecified, third trimester: Secondary | ICD-10-CM

## 2016-05-11 DIAGNOSIS — Z3483 Encounter for supervision of other normal pregnancy, third trimester: Secondary | ICD-10-CM

## 2016-05-11 NOTE — Progress Notes (Signed)
Lissa HoardSonia Perez-Tinoco is a 25 y.o. G3P1011 at 4668w6d for routine follow up. Concerns today include foot and hand swelling. Reporting contractions at irregular intervals about every other day.  See flow sheet for details.  A/P: Pregnancy at 6468w6d. Doing well.  Pregnancy issues include: none.   1. Supervision of normal pregnancy in third trimester Labor precautions reviewed. Safe sleep discussed. Kick counts reviewed. Follow up 1 week.  2. GBS positive Will need antibiotics during labor. NKDA.   Tarri AbernethyAbigail J Rider Ermis, MD PGY-1 [facility] Pager [phone]

## 2016-05-11 NOTE — Patient Instructions (Addendum)
It was nice meeting you today Kathy Salazar!   If you begin feeling regular contractions, feel a gush of fluid as if your water broke, or have vaginal bleeding, [facility].   We will see you back in one week.   Be well,  Dr. Natale MilchLancaster

## 2016-05-14 ENCOUNTER — Telehealth: Payer: Self-pay | Admitting: *Deleted

## 2016-05-14 NOTE — Telephone Encounter (Signed)
LVM for pt to call back to inform her of below. Zimmerman Rumple, Tnia Anglada D, CMA  

## 2016-05-14 NOTE — Telephone Encounter (Signed)
-----   Message from Narda Bondsalph A Nettey, MD sent at 05/11/2016 11:07 AM EDT ----- No evidence of a urinary tract infection. Please inform patient.

## 2016-05-15 NOTE — Telephone Encounter (Signed)
Message given to pt

## 2016-05-18 ENCOUNTER — Ambulatory Visit (INDEPENDENT_AMBULATORY_CARE_PROVIDER_SITE_OTHER): Payer: Self-pay | Admitting: Internal Medicine

## 2016-05-18 ENCOUNTER — Encounter: Payer: Self-pay | Admitting: Internal Medicine

## 2016-05-18 VITALS — BP 114/71 | HR 99 | Temp 98.6°F | Wt 171.0 lb

## 2016-05-18 DIAGNOSIS — Z3483 Encounter for supervision of other normal pregnancy, third trimester: Secondary | ICD-10-CM

## 2016-05-18 NOTE — Progress Notes (Signed)
Kathy Salazar is a 25 y.o. G3P1011 at 5017w6d here for routine follow up.  She reports continue hand and foot edema. Endorses irregular contractions primarily at night. Endorses continued fetal movement. Denies vaginal bleeding, discharge, or loss of fluids.  See flow sheet for details.  A/P: Pregnancy at 3117w6d. Doing well.   Pregnancy issues include GBS positive.  Infant feeding choice: formula Infant circumcision desired: not applicable  GBS and gc/chlamydia testing results were reviewed today.   Labor and fetal movement precautions reviewed.  1. Supervision of normal pregnancy in third trimester: As patient will be 40w tomorrow, will have NST x2 next week, then schedule induction at 41w. [facility], so unable to schedule NST appt today, but will call to schedule on Monday and alert patient of time of appointment.  Labor precautions reviewed.  Safe sleep discussed.  Kick counts reviewed.   2. GBS positive  Will need antibiotics during labor. NKDA.  Tarri AbernethyAbigail J Shamere Campas, MD PGY-1 [facility] Pager [phone]

## 2016-05-18 NOTE — Patient Instructions (Addendum)
It was nice seeing you again today!   If you begin to have vaginal bleeding, feel contractions, or notice a gush of fluid (like your water broke), please [facility] right [facility].   We will call you on Monday to let you know whether we were able to schedule an appointment or not. If we are not able to schedule the appointment, it is best to go to the emergency [facility] and have the testing (called "NST") performed there.   If you have any questions or concerns, please feel free to call us at any time.   Be well,  Dr. Natale MilchLancaster

## 2016-05-21 ENCOUNTER — Other Ambulatory Visit: Payer: Self-pay | Admitting: *Deleted

## 2016-05-21 ENCOUNTER — Inpatient Hospital Stay (HOSPITAL_COMMUNITY)
Admission: AD | Admit: 2016-05-21 | Discharge: 2016-05-21 | Disposition: A | Discharge status: Home / Self Care | Payer: Self-pay | Source: Ambulatory Visit | Attending: Obstetrics and Gynecology | Admitting: Obstetrics and Gynecology

## 2016-05-21 ENCOUNTER — Encounter (HOSPITAL_COMMUNITY): Payer: Self-pay

## 2016-05-21 DIAGNOSIS — Z3493 Encounter for supervision of normal pregnancy, unspecified, third trimester: Secondary | ICD-10-CM | POA: Insufficient documentation

## 2016-05-21 DIAGNOSIS — O480 Post-term pregnancy: Secondary | ICD-10-CM

## 2016-05-21 NOTE — Progress Notes (Signed)
NOtified of pt arrival in MAU and exam with brown discharge and no bleeding. Will discharge pt home.

## 2016-05-21 NOTE — Discharge Instructions (Signed)
Third Trimester of Pregnancy °The third trimester is from week 29 through week 42, months 7 through 9. The third trimester is a time when the fetus is growing rapidly. At the end of the ninth month, the fetus is about 20 inches in length and weighs 6-10 pounds.  °BODY CHANGES °Your body goes through many changes during pregnancy. The changes vary from woman to woman.  °· Your weight will continue to increase. You can expect to gain 25-35 pounds (11-16 kg) by the end of the pregnancy. °· You may begin to get stretch marks on your hips, abdomen, and breasts. °· You may urinate more often because the fetus is moving lower into your pelvis and pressing on your bladder. °· You may develop or continue to have heartburn as a result of your pregnancy. °· You may develop constipation because certain hormones are causing the muscles that push waste through your intestines to slow down. °· You may develop hemorrhoids or swollen, bulging veins (varicose veins). °· You may have pelvic pain because of the weight gain and pregnancy hormones relaxing your joints between the bones in your pelvis. Backaches may result from overexertion of the muscles supporting your posture. °· You may have changes in your hair. These can include thickening of your hair, rapid growth, and changes in texture. Some women also have hair loss during or after pregnancy, or hair that feels dry or thin. Your hair will most likely return to normal after your baby is born. °· Your breasts will continue to grow and be tender. A yellow discharge may leak from your breasts called colostrum. °· Your belly button may stick out. °· You may feel short of breath because of your expanding uterus. °· You may notice the fetus "dropping," or moving lower in your abdomen. °· You may have a bloody mucus discharge. This usually occurs a few days to a week before labor begins. °· Your cervix becomes thin and soft (effaced) near your due date. °WHAT TO EXPECT AT YOUR PRENATAL  EXAMS  °You will have prenatal exams every 2 weeks until week 36. Then, you will have weekly prenatal exams. During a routine prenatal visit: °· You will be weighed to make sure you and the fetus are growing normally. °· Your blood pressure is taken. °· Your abdomen will be measured to track your baby's growth. °· The fetal heartbeat will be listened to. °· Any test results from the previous visit will be discussed. °· You may have a cervical check near your due date to see if you have effaced. °At around 36 weeks, your caregiver will check your cervix. At the same time, your caregiver will also perform a test on the secretions of the vaginal tissue. This test is to determine if a type of bacteria, Group B streptococcus, is present. Your caregiver will explain this further. °Your caregiver may ask you: °· What your birth plan is. °· How you are feeling. °· If you are feeling the baby move. °· If you have had any abnormal symptoms, such as leaking fluid, bleeding, severe headaches, or abdominal cramping. °· If you are using any tobacco products, including cigarettes, chewing tobacco, and electronic cigarettes. °· If you have any questions. °Other tests or screenings that may be performed during your third trimester include: °· Blood tests that check for low iron levels (anemia). °· Fetal testing to check the health, activity level, and growth of the fetus. Testing is done if you have certain medical conditions or if   there are problems during the pregnancy. °· HIV (human immunodeficiency virus) testing. If you are at high risk, you may be screened for HIV during your third trimester of pregnancy. °FALSE LABOR °You may feel small, irregular contractions that eventually go away. These are called Braxton Hicks contractions, or false labor. Contractions may last for hours, days, or even weeks before true labor sets in. If contractions come at regular intervals, intensify, or become painful, it is best to be seen by your  caregiver.  °SIGNS OF LABOR  °· Menstrual-like cramps. °· Contractions that are 5 minutes apart or less. °· Contractions that start on the top of the uterus and spread down to the lower abdomen and back. °· A sense of increased pelvic pressure or back pain. °· A watery or bloody mucus discharge that comes from the vagina. °If you have any of these signs before the 37th week of pregnancy, call your caregiver right away. You need to go to the hospital to get checked immediately. °HOME CARE INSTRUCTIONS  °· Avoid all smoking, herbs, alcohol, and unprescribed drugs. These chemicals affect the formation and growth of the baby. °· Do not use any tobacco products, including cigarettes, chewing tobacco, and electronic cigarettes. If you need help quitting, ask your health care provider. You may receive counseling support and other resources to help you quit. °· Follow your caregiver's instructions regarding medicine use. There are medicines that are either safe or unsafe to take during pregnancy. °· Exercise only as directed by your caregiver. Experiencing uterine cramps is a good sign to stop exercising. °· Continue to eat regular, healthy meals. °· Wear a good support bra for breast tenderness. °· Do not use hot tubs, steam rooms, or saunas. °· Wear your seat belt at all times when driving. °· Avoid raw meat, uncooked cheese, cat litter boxes, and soil used by cats. These carry germs that can cause birth defects in the baby. °· Take your prenatal vitamins. °· Take 1500-2000 mg of calcium daily starting at the 20th week of pregnancy until you deliver your baby. °· Try taking a stool softener (if your caregiver approves) if you develop constipation. Eat more high-fiber foods, such as fresh vegetables or fruit and whole grains. Drink plenty of fluids to keep your urine clear or pale yellow. °· Take warm sitz baths to soothe any pain or discomfort caused by hemorrhoids. Use hemorrhoid cream if your caregiver approves. °· If  you develop varicose veins, wear support hose. Elevate your feet for 15 minutes, 3-4 times a day. Limit salt in your diet. °· Avoid heavy lifting, wear low heal shoes, and practice good posture. °· Rest a lot with your legs elevated if you have leg cramps or low back pain. °· Visit your dentist if you have not gone during your pregnancy. Use a soft toothbrush to brush your teeth and be gentle when you floss. °· A sexual relationship may be continued unless your caregiver directs you otherwise. °· Do not travel far distances unless it is absolutely necessary and only with the approval of your caregiver. °· Take prenatal classes to understand, practice, and ask questions about the labor and delivery. °· Make a trial run to the hospital. °· Pack your hospital bag. °· Prepare the baby's nursery. °· Continue to go to all your prenatal visits as directed by your caregiver. °SEEK MEDICAL CARE IF: °· You are unsure if you are in labor or if your water has broken. °· You have dizziness. °· You have   mild pelvic cramps, pelvic pressure, or nagging pain in your abdominal area. °· You have persistent nausea, vomiting, or diarrhea. °· You have a bad smelling vaginal discharge. °· You have pain with urination. °SEEK IMMEDIATE MEDICAL CARE IF:  °· You have a fever. °· You are leaking fluid from your vagina. °· You have spotting or bleeding from your vagina. °· You have severe abdominal cramping or pain. °· You have rapid weight loss or gain. °· You have shortness of breath with chest pain. °· You notice sudden or extreme swelling of your face, hands, ankles, feet, or legs. °· You have not felt your baby move in over an hour. °· You have severe headaches that do not go away with medicine. °· You have vision changes. °  °This information is not intended to replace advice given to you by your health care provider. Make sure you discuss any questions you have with your health care provider. °  °Document Released: 11/20/2001 Document  Revised: 12/17/2014 Document Reviewed: 01/27/2013 °[employer] Interactive Patient Education ©2016 [employer] °Fetal Movement Counts °Patient Name: __________________________________________________ Patient Due Date: ____________________ °Performing a fetal movement count is highly recommended in high-risk pregnancies, but it is good for every pregnant woman to do. Your health care provider may ask you to start counting fetal movements at 28 weeks of the pregnancy. Fetal movements often increase: °· After eating a full meal. °· After physical activity. °· After eating or drinking something sweet or cold. °· At rest. °Pay attention to when you feel the baby is most active. This will help you notice a pattern of your baby's sleep and wake cycles and what factors contribute to an increase in fetal movement. It is important to perform a fetal movement count at the same time each day when your baby is normally most active.  °HOW TO COUNT FETAL MOVEMENTS °· Find a quiet and comfortable area to sit or lie down on your left side. Lying on your left side provides the best blood and oxygen circulation to your baby. °· Write down the day and time on a sheet of paper or in a journal. °· Start counting kicks, flutters, swishes, rolls, or jabs in a 2-hour period. You should feel at least 10 movements within 2 hours. °· If you do not feel 10 movements in 2 hours, wait 2-3 hours and count again. Look for a change in the pattern or not enough counts in 2 hours. °SEEK MEDICAL CARE IF: °· You feel less than 10 counts in 2 hours, tried twice. °· There is no movement in over an hour. °· The pattern is changing or taking longer each day to reach 10 counts in 2 hours. °· You feel the baby is not moving as he or she usually does. °Date: ____________ Movements: ____________ Start time: ____________ Finish time: ____________  °Date: ____________ Movements: ____________ Start time: ____________ Finish time: ____________ °Date: ____________  Movements: ____________ Start time: ____________ Finish time: ____________ °Date: ____________ Movements: ____________ Start time: ____________ Finish time: ____________ °Date: ____________ Movements: ____________ Start time: ____________ Finish time: ____________ °Date: ____________ Movements: ____________ Start time: ____________ Finish time: ____________ °Date: ____________ Movements: ____________ Start time: ____________ Finish time: ____________ °Date: ____________ Movements: ____________ Start time: ____________ Finish time: ____________  °Date: ____________ Movements: ____________ Start time: ____________ Finish time: ____________ °Date: ____________ Movements: ____________ Start time: ____________ Finish time: ____________ °Date: ____________ Movements: ____________ Start time: ____________ Finish time: ____________ °Date: ____________ Movements: ____________ Start time: ____________ Finish time: ____________ °Date:   ____________ Movements: ____________ Start time: ____________ Finish time: ____________ °Date: ____________ Movements: ____________ Start time: ____________ Finish time: ____________ °Date: ____________ Movements: ____________ Start time: ____________ Finish time: ____________  °Date: ____________ Movements: ____________ Start time: ____________ Finish time: ____________ °Date: ____________ Movements: ____________ Start time: ____________ Finish time: ____________ °Date: ____________ Movements: ____________ Start time: ____________ Finish time: ____________ °Date: ____________ Movements: ____________ Start time: ____________ Finish time: ____________ °Date: ____________ Movements: ____________ Start time: ____________ Finish time: ____________ °Date: ____________ Movements: ____________ Start time: ____________ Finish time: ____________ °Date: ____________ Movements: ____________ Start time: ____________ Finish time: ____________  °Date: ____________ Movements: ____________ Start time:  ____________ Finish time: ____________ °Date: ____________ Movements: ____________ Start time: ____________ Finish time: ____________ °Date: ____________ Movements: ____________ Start time: ____________ Finish time: ____________ °Date: ____________ Movements: ____________ Start time: ____________ Finish time: ____________ °Date: ____________ Movements: ____________ Start time: ____________ Finish time: ____________ °Date: ____________ Movements: ____________ Start time: ____________ Finish time: ____________ °Date: ____________ Movements: ____________ Start time: ____________ Finish time: ____________  °Date: ____________ Movements: ____________ Start time: ____________ Finish time: ____________ °Date: ____________ Movements: ____________ Start time: ____________ Finish time: ____________ °Date: ____________ Movements: ____________ Start time: ____________ Finish time: ____________ °Date: ____________ Movements: ____________ Start time: ____________ Finish time: ____________ °Date: ____________ Movements: ____________ Start time: ____________ Finish time: ____________ °Date: ____________ Movements: ____________ Start time: ____________ Finish time: ____________ °Date: ____________ Movements: ____________ Start time: ____________ Finish time: ____________  °Date: ____________ Movements: ____________ Start time: ____________ Finish time: ____________ °Date: ____________ Movements: ____________ Start time: ____________ Finish time: ____________ °Date: ____________ Movements: ____________ Start time: ____________ Finish time: ____________ °Date: ____________ Movements: ____________ Start time: ____________ Finish time: ____________ °Date: ____________ Movements: ____________ Start time: ____________ Finish time: ____________ °Date: ____________ Movements: ____________ Start time: ____________ Finish time: ____________ °Date: ____________ Movements: ____________ Start time: ____________ Finish time: ____________  °Date:  ____________ Movements: ____________ Start time: ____________ Finish time: ____________ °Date: ____________ Movements: ____________ Start time: ____________ Finish time: ____________ °Date: ____________ Movements: ____________ Start time: ____________ Finish time: ____________ °Date: ____________ Movements: ____________ Start time: ____________ Finish time: ____________ °Date: ____________ Movements: ____________ Start time: ____________ Finish time: ____________ °Date: ____________ Movements: ____________ Start time: ____________ Finish time: ____________ °Date: ____________ Movements: ____________ Start time: ____________ Finish time: ____________  °Date: ____________ Movements: ____________ Start time: ____________ Finish time: ____________ °Date: ____________ Movements: ____________ Start time: ____________ Finish time: ____________ °Date: ____________ Movements: ____________ Start time: ____________ Finish time: ____________ °Date: ____________ Movements: ____________ Start time: ____________ Finish time: ____________ °Date: ____________ Movements: ____________ Start time: ____________ Finish time: ____________ °Date: ____________ Movements: ____________ Start time: ____________ Finish time: ____________ °  °This information is not intended to replace advice given to you by your health care provider. Make sure you discuss any questions you have with your health care provider. °  °Document Released: 12/26/2006 Document Revised: 12/17/2014 Document Reviewed: 09/22/2012 °[employer] Interactive Patient Education ©2016 [employer] °Braxton Hicks Contractions °Contractions of the uterus can occur throughout pregnancy. Contractions are not always a sign that you are in labor.  °WHAT ARE BRAXTON HICKS CONTRACTIONS?  °Contractions that occur before labor are called Braxton Hicks contractions, or false labor. Toward the end of pregnancy (32-34 weeks), these contractions can develop more often and may become more  forceful. This is not true labor because these contractions do not result in opening (dilatation) and thinning of the cervix. They are sometimes difficult to tell apart from true labor because these contractions can be forceful and people have different pain tolerances. You should   not feel embarrassed if you go to the hospital with false labor. Sometimes, the only way to tell if you are in true labor is for your health care provider to look for changes in the cervix. °If there are no prenatal problems or other health problems associated with the pregnancy, it is completely safe to be sent home with false labor and await the onset of true labor. °HOW CAN YOU TELL THE DIFFERENCE BETWEEN TRUE AND FALSE LABOR? °False Labor °· The contractions of false labor are usually shorter and not as hard as those of true labor.   °· The contractions are usually irregular.   °· The contractions are often felt in the front of the lower abdomen and in the groin.   °· The contractions may go away when you walk around or change positions while lying down.   °· The contractions get weaker and are shorter lasting as time goes on.   °· The contractions do not usually become progressively stronger, regular, and closer together as with true labor.   °True Labor °· Contractions in true labor last 30-70 seconds, become very regular, usually become more intense, and increase in frequency.   °· The contractions do not go away with walking.   °· The discomfort is usually felt in the top of the uterus and spreads to the lower abdomen and low back.   °· True labor can be determined by your health care provider with an exam. This will show that the cervix is dilating and getting thinner.   °WHAT TO REMEMBER °· Keep up with your usual exercises and follow other instructions given by your health care provider.   °· Take medicines as directed by your health care provider.   °· Keep your regular prenatal appointments.   °· Eat and drink lightly if you  think you are going into labor.   °· If Braxton Hicks contractions are making you uncomfortable:   °· Change your position from lying down or resting to walking, or from walking to resting.   °· Sit and rest in a tub of warm water.   °· Drink 2-3 glasses of water. Dehydration may cause these contractions.   °· Do slow and deep breathing several times an hour.   °WHEN SHOULD I SEEK IMMEDIATE MEDICAL CARE? °Seek immediate medical care if: °· Your contractions become stronger, more regular, and closer together.   °· You have fluid leaking or gushing from your vagina.   °· You have a fever.   °· You pass blood-tinged mucus.   °· You have vaginal bleeding.   °· You have continuous abdominal pain.   °· You have low back pain that you never had before.   °· You feel your baby's head pushing down and causing pelvic pressure.   °· Your baby is not moving as much as it used to.   °  °This information is not intended to replace advice given to you by your health care provider. Make sure you discuss any questions you have with your health care provider. °  °Document Released: 11/26/2005 Document Revised: 12/01/2013 Document Reviewed: 09/07/2013 °[employer]vier Interactive Patient Education ©2016 [employer] ° °

## 2016-05-21 NOTE — Progress Notes (Signed)
Tried to call [facility] to schedule NST and no answer.  Patient did go to [facility]U this morning for reddish discharge.  She was sent home.  Will call again after lunch to schedule her and also ordered nst.  Jazmin Hartsell,CMA

## 2016-05-21 NOTE — MAU Note (Signed)
Pt presents stating she had bright red vaginal bleeding this am that has slowed down but still there when she wipes. Also having contractions every 10-15 minutes. Reports good fetal movement. No leaking of fluid.

## 2016-05-22 ENCOUNTER — Ambulatory Visit: Payer: Self-pay | Admitting: Family Medicine

## 2016-05-22 ENCOUNTER — Encounter (HOSPITAL_COMMUNITY): Payer: Self-pay

## 2016-05-22 ENCOUNTER — Inpatient Hospital Stay (HOSPITAL_COMMUNITY): Payer: Medicaid Other | Admitting: Anesthesiology

## 2016-05-22 ENCOUNTER — Inpatient Hospital Stay (HOSPITAL_COMMUNITY)
Admission: AD | Admit: 2016-05-22 | Discharge: 2016-05-24 | DRG: 775 | Disposition: A | Discharge status: Home / Self Care | Payer: Medicaid Other | Source: Ambulatory Visit | Attending: Family Medicine | Admitting: Family Medicine

## 2016-05-22 DIAGNOSIS — O99824 Streptococcus B carrier state complicating childbirth: Secondary | ICD-10-CM | POA: Diagnosis present

## 2016-05-22 DIAGNOSIS — D696 Thrombocytopenia, unspecified: Secondary | ICD-10-CM | POA: Diagnosis present

## 2016-05-22 DIAGNOSIS — O9912 Other diseases of the blood and blood-forming organs and certain disorders involving the immune mechanism complicating childbirth: Secondary | ICD-10-CM | POA: Diagnosis present

## 2016-05-22 DIAGNOSIS — Z3A40 40 weeks gestation of pregnancy: Secondary | ICD-10-CM | POA: Diagnosis not present

## 2016-05-22 DIAGNOSIS — Z3493 Encounter for supervision of normal pregnancy, unspecified, third trimester: Secondary | ICD-10-CM

## 2016-05-22 DIAGNOSIS — O4202 Full-term premature rupture of membranes, onset of labor within 24 hours of rupture: Secondary | ICD-10-CM | POA: Diagnosis present

## 2016-05-22 DIAGNOSIS — IMO0001 Reserved for inherently not codable concepts without codable children: Secondary | ICD-10-CM

## 2016-05-22 DIAGNOSIS — O99113 Other diseases of the blood and blood-forming organs and certain disorders involving the immune mechanism complicating pregnancy, third trimester: Secondary | ICD-10-CM

## 2016-05-22 LAB — TYPE AND SCREEN
ABO/RH(D): A POS
Antibody Screen: NEGATIVE

## 2016-05-22 LAB — CBC
HCT: 34.4 % — ABNORMAL LOW (ref 36.0–46.0)
HCT: 31.7 % — ABNORMAL LOW (ref 36.0–46.0)
Hemoglobin: 11.2 g/dL — ABNORMAL LOW (ref 12.0–15.0)
Hemoglobin: 10.5 g/dL — ABNORMAL LOW (ref 12.0–15.0)
MCH: 25.2 pg — ABNORMAL LOW (ref 26.0–34.0)
MCH: 25.7 pg — ABNORMAL LOW (ref 26.0–34.0)
MCHC: 32.6 g/dL (ref 30.0–36.0)
MCHC: 33.1 g/dL (ref 30.0–36.0)
MCV: 77.5 fL — ABNORMAL LOW (ref 78.0–100.0)
MCV: 77.5 fL — ABNORMAL LOW (ref 78.0–100.0)
Platelets: 125 10*3/uL — ABNORMAL LOW (ref 150–400)
Platelets: 121 10*3/uL — ABNORMAL LOW (ref 150–400)
RBC: 4.44 MIL/uL (ref 3.87–5.11)
RBC: 4.09 MIL/uL (ref 3.87–5.11)
RDW: 14.3 % (ref 11.5–15.5)
RDW: 14.4 % (ref 11.5–15.5)
WBC: 9 10*3/uL (ref 4.0–10.5)
WBC: 9.3 10*3/uL (ref 4.0–10.5)

## 2016-05-22 LAB — RPR: RPR Ser Ql: NONREACTIVE

## 2016-05-22 MED ORDER — SOD CITRATE-CITRIC ACID 500-334 MG/5ML PO SOLN: 30.0000 mL | ORAL | Status: DC | PRN

## 2016-05-22 MED ORDER — ONDANSETRON HCL 4 MG/2ML IJ SOLN: 4.0000 mg | Freq: Four times a day (QID) | INTRAMUSCULAR | Status: DC | PRN

## 2016-05-22 MED ORDER — EPHEDRINE 5 MG/ML INJ: 10.0000 mg | INTRAVENOUS | Status: DC | PRN

## 2016-05-22 MED ORDER — OXYTOCIN BOLUS FROM INFUSION
500.0000 mL | INTRAVENOUS | Status: DC
  Administered 2016-05-22: 500 mL via INTRAVENOUS

## 2016-05-22 MED ORDER — DIPHENHYDRAMINE HCL 50 MG/ML IJ SOLN: 12.5000 mg | INTRAMUSCULAR | Status: DC | PRN

## 2016-05-22 MED ORDER — LACTATED RINGERS IV SOLN: 500.0000 mL | INTRAVENOUS | Status: DC | PRN

## 2016-05-22 MED ORDER — IBUPROFEN 600 MG PO TABS
600.0000 mg | ORAL_TABLET | Freq: Four times a day (QID) | ORAL | Status: DC
  Administered 2016-05-22 – 2016-05-24 (×10): 600 mg via ORAL
  Filled 2016-05-22 (×10): qty 1

## 2016-05-22 MED ORDER — PHENYLEPHRINE 40 MCG/ML (10ML) SYRINGE FOR IV PUSH (FOR BLOOD PRESSURE SUPPORT): 80.0000 ug | PREFILLED_SYRINGE | INTRAVENOUS | Status: DC | PRN

## 2016-05-22 MED ORDER — ONDANSETRON HCL 4 MG PO TABS: 4.0000 mg | ORAL_TABLET | ORAL | Status: DC | PRN

## 2016-05-22 MED ORDER — PENICILLIN G POTASSIUM 5000000 UNITS IJ SOLR
2.5000 10*6.[IU] | INTRAVENOUS | Status: DC
  Administered 2016-05-22 (×2): 2.5 10*6.[IU] via INTRAVENOUS
  Filled 2016-05-22 (×6): qty 2.5

## 2016-05-22 MED ORDER — LACTATED RINGERS IV SOLN
INTRAVENOUS | Status: DC
  Administered 2016-05-22: 03:00:00 via INTRAVENOUS

## 2016-05-22 MED ORDER — ONDANSETRON HCL 4 MG/2ML IJ SOLN: 4.0000 mg | INTRAMUSCULAR | Status: DC | PRN

## 2016-05-22 MED ORDER — DOCUSATE SODIUM 100 MG PO CAPS
100.0000 mg | ORAL_CAPSULE | Freq: Two times a day (BID) | ORAL | Status: DC
Start: 2016-05-22 — End: 2016-05-24
  Administered 2016-05-23 – 2016-05-24 (×4): 100 mg via ORAL
  Filled 2016-05-22 (×4): qty 1

## 2016-05-22 MED ORDER — LACTATED RINGERS IV SOLN: 500.0000 mL | Freq: Once | INTRAVENOUS | Status: DC

## 2016-05-22 MED ORDER — ZOLPIDEM TARTRATE 5 MG PO TABS: 5.0000 mg | ORAL_TABLET | Freq: Every evening | ORAL | Status: DC | PRN

## 2016-05-22 MED ORDER — FENTANYL 2.5 MCG/ML BUPIVACAINE 1/10 % EPIDURAL INFUSION (WH - ANES)
14.0000 mL/h | INTRAMUSCULAR | Status: DC | PRN
  Administered 2016-05-22 (×2): 14 mL/h via EPIDURAL
  Filled 2016-05-22: qty 125

## 2016-05-22 MED ORDER — PENICILLIN G POTASSIUM 5000000 UNITS IJ SOLR
5.0000 10*6.[IU] | Freq: Once | INTRAVENOUS | Status: AC
  Administered 2016-05-22: 5 10*6.[IU] via INTRAVENOUS
  Filled 2016-05-22: qty 5

## 2016-05-22 MED ORDER — BENZOCAINE-MENTHOL 20-0.5 % EX AERO
1.0000 "application " | INHALATION_SPRAY | CUTANEOUS | Status: DC | PRN
  Filled 2016-05-22: qty 56

## 2016-05-22 MED ORDER — COCONUT OIL OIL
1.0000 | TOPICAL_OIL | Status: DC | PRN
Start: 2016-05-22 — End: 2016-05-24

## 2016-05-22 MED ORDER — TETANUS-DIPHTH-ACELL PERTUSSIS 5-2.5-18.5 LF-MCG/0.5 IM SUSP: 0.5000 mL | Freq: Once | INTRAMUSCULAR | Status: DC

## 2016-05-22 MED ORDER — EPHEDRINE 5 MG/ML INJ
10.0000 mg | INTRAVENOUS | Status: DC | PRN
Start: 2016-05-22 — End: 2016-05-22

## 2016-05-22 MED ORDER — OXYCODONE-ACETAMINOPHEN 5-325 MG PO TABS
1.0000 | ORAL_TABLET | ORAL | Status: DC | PRN
  Administered 2016-05-23: 1 via ORAL
  Filled 2016-05-22: qty 1

## 2016-05-22 MED ORDER — PRENATAL MULTIVITAMIN CH
1.0000 | ORAL_TABLET | Freq: Every day | ORAL | Status: DC
  Administered 2016-05-23 – 2016-05-24 (×2): 1 via ORAL
  Filled 2016-05-22 (×2): qty 1

## 2016-05-22 MED ORDER — DIBUCAINE 1 % RE OINT
1.0000 "application " | TOPICAL_OINTMENT | RECTAL | Status: DC | PRN
  Administered 2016-05-24: 1 via RECTAL
  Filled 2016-05-22: qty 28

## 2016-05-22 MED ORDER — DIPHENHYDRAMINE HCL 25 MG PO CAPS: 25.0000 mg | ORAL_CAPSULE | Freq: Four times a day (QID) | ORAL | Status: DC | PRN

## 2016-05-22 MED ORDER — LIDOCAINE HCL (PF) 1 % IJ SOLN
INTRAMUSCULAR | Status: DC | PRN
  Administered 2016-05-22 (×2): 4 mL via EPIDURAL

## 2016-05-22 MED ORDER — ACETAMINOPHEN 325 MG PO TABS
650.0000 mg | ORAL_TABLET | ORAL | Status: DC | PRN
  Administered 2016-05-24: 650 mg via ORAL
  Filled 2016-05-22: qty 2

## 2016-05-22 MED ORDER — LIDOCAINE HCL (PF) 1 % IJ SOLN
30.0000 mL | INTRAMUSCULAR | Status: AC | PRN
  Administered 2016-05-22: 30 mL via SUBCUTANEOUS
  Filled 2016-05-22: qty 30

## 2016-05-22 MED ORDER — ACETAMINOPHEN 325 MG PO TABS: 650.0000 mg | ORAL_TABLET | ORAL | Status: DC | PRN

## 2016-05-22 MED ORDER — WITCH HAZEL-GLYCERIN EX PADS
1.0000 "application " | MEDICATED_PAD | CUTANEOUS | Status: DC | PRN
  Administered 2016-05-24: 1 via TOPICAL

## 2016-05-22 MED ORDER — SENNOSIDES-DOCUSATE SODIUM 8.6-50 MG PO TABS
2.0000 | ORAL_TABLET | ORAL | Status: DC
  Administered 2016-05-23 – 2016-05-24 (×2): 2 via ORAL
  Filled 2016-05-22 (×2): qty 2

## 2016-05-22 MED ORDER — PHENYLEPHRINE 40 MCG/ML (10ML) SYRINGE FOR IV PUSH (FOR BLOOD PRESSURE SUPPORT)
PREFILLED_SYRINGE | INTRAVENOUS | Status: AC
  Filled 2016-05-22: qty 10

## 2016-05-22 MED ORDER — FENTANYL 2.5 MCG/ML BUPIVACAINE 1/10 % EPIDURAL INFUSION (WH - ANES)
INTRAMUSCULAR | Status: AC
  Filled 2016-05-22: qty 125

## 2016-05-22 MED ORDER — OXYCODONE-ACETAMINOPHEN 5-325 MG PO TABS: 2.0000 | ORAL_TABLET | ORAL | Status: DC | PRN

## 2016-05-22 MED ORDER — OXYTOCIN 40 UNITS IN LACTATED RINGERS INFUSION - SIMPLE MED
2.5000 [IU]/h | INTRAVENOUS | Status: DC
  Filled 2016-05-22: qty 1000

## 2016-05-22 MED ORDER — SIMETHICONE 80 MG PO CHEW: 80.0000 mg | CHEWABLE_TABLET | ORAL | Status: DC | PRN

## 2016-05-22 NOTE — Anesthesia Preprocedure Evaluation (Signed)
Anesthesia Evaluation  Patient identified by MRN, date of birth, ID band Patient awake    Reviewed: Allergy & Precautions, NPO status , Patient's Chart, lab work & pertinent test results  Airway Mallampati: II  TM Distance: >3 FB Neck ROM: Full    Dental  (+) Teeth Intact, Dental Advisory Given   Pulmonary neg pulmonary ROS,    breath sounds clear to auscultation       Cardiovascular  Rhythm:Regular Rate:Normal     Neuro/Psych negative neurological ROS  negative psych ROS   GI/Hepatic negative GI ROS, Neg liver ROS,   Endo/Other  negative endocrine ROS  Renal/GU negative Renal ROS     Musculoskeletal negative musculoskeletal ROS (+)   Abdominal   Peds  Hematology negative hematology ROS (+)   Anesthesia Other Findings   Reproductive/Obstetrics (+) Pregnancy                             Lab Results  Component Value Date   WBC 9.0 05/22/2016   HGB 11.2* 05/22/2016   HCT 34.4* 05/22/2016   MCV 77.5* 05/22/2016   PLT 125* 05/22/2016   No results found for: INR, PROTIME   Anesthesia Physical Anesthesia Plan  ASA: II  Anesthesia Plan: Epidural   Post-op Pain Management:    Induction:   Airway Management Planned:   Additional Equipment:   Intra-op Plan:   Post-operative Plan:   Informed Consent: I have reviewed the patients History and Physical, chart, labs and discussed the procedure including the risks, benefits and alternatives for the proposed anesthesia with the patient or authorized representative who has indicated his/her understanding and acceptance.     Plan Discussed with:   Anesthesia Plan Comments:         Anesthesia Quick Evaluation

## 2016-05-22 NOTE — Progress Notes (Signed)
Notified of pt arrival in MAU and exam. Will admit pt to labor and delivery

## 2016-05-22 NOTE — Anesthesia Postprocedure Evaluation (Signed)
Anesthesia Post Note  Patient: Kathy Salazar  Procedure(s) Performed: * No procedures listed *  Patient location during evaluation: Mother Baby Anesthesia Type: Epidural Level of consciousness: awake and alert Pain management: satisfactory to patient Vital Signs Assessment: post-procedure vital signs reviewed and stable Respiratory status: respiratory function stable Cardiovascular status: stable Postop Assessment: no headache, no backache, epidural receding, patient able to bend at knees, no signs of nausea or vomiting and adequate PO intake Anesthetic complications: no Comments: Comfort level was assessed by AnesthesiaTeam and the patient was pleased with the care, interventions, and services provided by the Department of Anesthesia.     Last Vitals:  Filed Vitals:   05/22/16 1317 05/22/16 1430  BP: 124/79 102/60  Pulse: 118 100  Temp:  36.9 C  Resp: 18 18    Last Pain:  Filed Vitals:   05/22/16 1458  PainSc: 5    Pain Goal: Patients Stated Pain Goal: 5 (05/22/16 0730)               Karleen DolphinFUSSELL,Ademide Schaberg

## 2016-05-22 NOTE — H&P (Signed)
LABOR AND DELIVERY ADMISSION HISTORY AND PHYSICAL NOTE  Kathy Salazar is a 25 y.o. female G3P1011 with IUP at 2136w3d by 9 wk u/s presenting for painful contractions. Began yesterday, checked in mau and sent home 1 cm dilated. ctxns all day today and this evening more painful and every 3 minutes.   She reports positive fetal movement. She denies leakage of fluid or vaginal bleeding.  Prenatal History/Complications:  Past Medical History: Past Medical History  Diagnosis Date  . No pertinent past medical history     Past Surgical History: Past Surgical History  Procedure Laterality Date  . Appendectomy      Obstetrical History: OB History    Gravida Para Term Preterm AB TAB SAB Ectopic Multiple Living   3 1 1  0 1 0 1 0 0 1      Social History: Social History   Social History  . Marital Status: Single    Spouse Name: N/A  . Number of Children: N/A  . Years of Education: N/A   Social History Main Topics  . Smoking status: Never Smoker   . Smokeless tobacco: None  . Alcohol Use: No  . Drug Use: No  . Sexual Activity: Yes    Birth Control/ Protection: None   Other Topics Concern  . None   Social History Narrative    Family History: Family History  Problem Relation Age of Onset  . Miscarriages / Stillbirths Sister   . Hyperlipidemia Sister     Allergies: No Known Allergies  Prescriptions prior to admission  Medication Sig Dispense Refill Last Dose  . cephALEXin (KEFLEX) 500 MG capsule Take 1 capsule (500 mg total) by mouth 2 (two) times daily. (Patient not taking: Reported on 12/28/2015) 14 capsule 0 Not Taking  . metroNIDAZOLE (FLAGYL) 500 MG tablet Take 1 tablet (500 mg total) by mouth 2 (two) times daily. One po bid x 7 days (Patient not taking: Reported on 12/28/2015) 14 tablet 0 Not Taking  . Prenatal Vit-Fe Fumarate-FA (PRENATAL MULTIVITAMIN) TABS Take 1 tablet by mouth every morning.   12/12/2015 at Unknown time     Review of Systems   All systems  reviewed and negative except as stated in HPI  There were no vitals taken for this visit. General appearance: alert, cooperative, appears stated age and mild distress Lungs: clear to auscultation bilaterally Heart: regular rate and rhythm Abdomen: soft, non-tender; bowel sounds normal Extremities: No calf swelling or tenderness Presentation: cephalic per rn exam Fetal monitoring: 150/mod/+a/one variable Uterine activity: q 3 min  Dilation: 4 Effacement (%): 80 Station: -2 Exam by:: Sharen Hintaroline Brewer RNC   Prenatal labs: ABO, Rh: A/POS/-- (11/11 0915) Antibody: NEG (11/11 0915) Rubella: !Error!imm RPR: NON REAC (03/24 1010)  HBsAg: NEGATIVE (11/11 0915)  HIV: NONREACTIVE (03/24 1010)  GBS: DETECTED, DET (05/19 1029)  1 hr Glucola: 136, 3 hr 78/149/120/86 Genetic screening:  Not performed Anatomy US: wnl  Prenatal Transfer Tool  Maternal Diabetes: No Genetic Screening: Declined Maternal Ultrasounds/Referrals: Normal Fetal Ultrasounds or other Referrals:  None Maternal Substance Abuse:  No Significant Maternal Medications:  None Significant Maternal Lab Results: Lab values include: Group B Strep positive  No results found for this or any previous visit (from the past 24 hour(s)).  Patient Active Problem List   Diagnosis Date Noted  . Supervision of normal pregnancy in third trimester 12/28/2015  . Contraception management 11/03/2013  . OTHER SPECIFIED VISUAL DISTURBANCES 12/25/2010    Assessment: Kathy Salazar is a 25 y.o. G3P1011 at  [redacted]w[redacted]d here for labor.   #Labor: expectant, intact #Pain: epidural #FWB: Cat 2 for one variable, will monitor for now #ID:  gbs pos - pcn #MOF: bottle; breastfeeding encouraged #MOC:depo #Circ:  n/a  Silvano Bilis 05/22/2016, 12:45 AM

## 2016-05-22 NOTE — MAU Note (Signed)
Pt presents complaining of worsening contractions since 2230. Denies leaking. Some bloody show still

## 2016-05-22 NOTE — Progress Notes (Signed)
Labor Progress Note Kathy Salazar is a 25 y.o. G3P1011 at 1044w3d presented for active labor S: no complaint at this time  O:  BP 106/69 mmHg  Pulse 98  Temp(Src) 98.3 F (36.8 C) (Oral)  Resp 18  Ht 5\' 3"  (1.6 m)  Wt 171 lb (77.565 kg)  BMI 30.30 kg/m2  SpO2 94%  LMP  (LMP Unknown) EFM: 140/mod var/+acc/no decls  CVE: Dilation: 6.5 Effacement (%): 100 Cervical Position: Middle Station: -1 Presentation: Vertex Exam by:: Malva CoganA. Schwarz RN    A&P: 25 y.o. [id number] 7444w3d admitted for active labor #Labor: expectant. AROM at 9:20 am. mild blood and mec stain #Pain: epidural #FWB: CAT-1 #GBS: PCN  Almon Herculesaye T Gonfa, MD 9:25 AM

## 2016-05-22 NOTE — Anesthesia Procedure Notes (Signed)
Epidural Patient location during procedure: OB Start time: 05/22/2016 2:07 AM End time: 05/22/2016 2:14 AM  Staffing Anesthesiologist: Shona SimpsonHOLLIS, Jrue Yambao D Performed by: anesthesiologist   Preanesthetic Checklist Completed: patient identified, site marked, surgical consent, pre-op evaluation, timeout performed, IV checked, risks and benefits discussed and monitors and equipment checked  Epidural Patient position: sitting Prep: ChloraPrep Patient monitoring: heart rate, continuous pulse ox and blood pressure Approach: midline Location: L3-L4 Injection technique: LOR saline  Needle:  Needle type: Tuohy  Needle gauge: 17 G Needle length: 9 cm Catheter type: closed end flexible Catheter size: 20 Guage Test dose: negative and 1.5% lidocaine  Assessment Events: blood not aspirated, injection not painful, no injection resistance and no paresthesia  Additional Notes LOR @ 6  Patient identified. Risks/Benefits/Options discussed with patient including but not limited to bleeding, infection, nerve damage, paralysis, failed block, incomplete pain control, headache, blood pressure changes, nausea, vomiting, reactions to medications, itching and postpartum back pain. Confirmed with bedside nurse the patient's most recent platelet count. Confirmed with patient that they are not currently taking any anticoagulation, have any bleeding history or any family history of bleeding disorders. Patient expressed understanding and wished to proceed. All questions were answered. Sterile technique was used throughout the entire procedure. Please see nursing notes for vital signs. Test dose was given through epidural catheter and negative prior to continuing to dose epidural or start infusion. Warning signs of high block given to the patient including shortness of breath, tingling/numbness in hands, complete motor block, or any concerning symptoms with instructions to call for help. Patient was given instructions on  fall risk and not to get out of bed. All questions and concerns addressed with instructions to call with any issues or inadequate analgesia.    Reason for block:procedure for pain

## 2016-05-23 NOTE — Progress Notes (Cosign Needed)
Post Partum Day 1 Subjective:  Kathy Salazar is a 25 y.o. [id number] 3251w3d s/p SVD to baby girl.  No acute events overnight.  Pt denies problems with ambulating, voiding or po intake.  She denies nausea or vomiting.  Pain is moderately controlled.  She has not had flatus.  Lochia Moderate.  Plan for birth control is Depo-Provera for the first 3 months and then plans to switch to OCP.  Method of Feeding: Formula.  Objective: Blood pressure 100/60, pulse 76, temperature 98.4 F (36.9 C), temperature source Oral, resp. rate 18, height 5\' 3"  (1.6 m), weight 77.565 kg (171 lb), SpO2 100 %, unknown if currently breastfeeding.  Physical Exam:  General: alert, cooperative and no distress Chest: normal WOB Heart: Regular rate Abdomen: +BS, soft, mild TTP (appropriate) Uterine Fundus: firm DVT Evaluation: No evidence of DVT seen on physical exam. Extremities: Mild edema   Recent Labs  05/22/16 0046 05/22/16 1323  HGB 11.2* 10.5*  HCT 34.4* 31.7*    Assessment/Plan:  ASSESSMENT: Kathy Salazar is a 25 y.o. [id number] 3051w3d s/p SVD.  Discharge home Continue routine PP care   LOS: 1 day   Gardner Servantes 05/23/2016, 7:31 AM

## 2016-05-24 MED ORDER — IBUPROFEN 600 MG PO TABS: 600.0000 mg | ORAL_TABLET | Freq: Four times a day (QID) | ORAL | Status: DC

## 2016-05-24 MED ORDER — ACETAMINOPHEN 325 MG PO TABS: 650.0000 mg | ORAL_TABLET | ORAL | Status: DC | PRN

## 2016-05-24 NOTE — Discharge Summary (Signed)
OB Discharge Summary     Patient Name: Kathy Salazar DOB: [birth year] MRN: [id number]  Date of admission: 05/22/2016 Delivering MD: Lyndel SafeNEWTON, Kathy Salazar   Date of discharge: 05/24/2016  Admitting diagnosis: 40.3 WEEKS CTX Intrauterine pregnancy: 6852w3d     Secondary diagnosis:  Active Problems:   Active labor at term   Gestational thrombocytopenia without hemorrhage in third trimester [facility](HCC)   Delivery normal   NSVD (normal spontaneous vaginal delivery)  Additional problems: none     Discharge diagnosis: Term Pregnancy Delivered                                                                                                Post partum procedures:none  Augmentation: Pitocin  Complications: None  Hospital course:  Onset of Labor With Vaginal Delivery     25 y.o. yo [id number] at 4952w3d was admitted with PROM on 05/22/2016. Patient had an uncomplicated labor course as follows:  Membrane Rupture Time/Date: 9:19 AM ,05/22/2016   Intrapartum Procedures: Episiotomy: None [1]                                         Lacerations:  1st degree [2];Perineal [11]  Patient had a delivery of a Viable infant. 05/22/2016  Information for the patient's newborn:  Kathy Salazar, Girl Kathy Salazar [[id number]][[id number]]  Delivery Method: Vaginal, Spontaneous Delivery (Filed from Delivery Summary)    Pateint had an uncomplicated postpartum course.  She is ambulating, tolerating a regular diet, passing flatus, and urinating well. Patient is discharged home in stable condition on 05/24/2016.    Physical exam  Filed Vitals:   05/22/16 1935 05/23/16 0609 05/23/16 1730 05/24/16 0622  BP: 107/67 100/60 104/66 109/83  Pulse: 91 76 82 70  Temp: 98.4 F (36.9 C)  98.1 F (36.7 C) 97.9 F (36.6 C)  TempSrc: Oral  Oral Oral  Resp: 20 18 16 18   Height:      Weight:      SpO2: 100%      General: alert, cooperative and no distress Lochia: appropriate Uterine Fundus: firm Incision: N/A DVT Evaluation: No evidence  of DVT seen on physical exam. Labs: Lab Results  Component Value Date   WBC 9.3 05/22/2016   HGB 10.5* 05/22/2016   HCT 31.7* 05/22/2016   MCV 77.5* 05/22/2016   PLT 121* 05/22/2016   CMP Latest Ref Rng 02/24/2016  Glucose 65 - 104 mg/dL 78    Discharge instruction: per After Visit Summary and "Baby and Me Booklet".  After visit meds:    Medication List    TAKE these medications        acetaminophen 325 MG tablet  Commonly known as:  TYLENOL  Take 2 tablets (650 mg total) by mouth every 4 (four) hours as needed (for pain scale < 4).     ibuprofen 600 MG tablet  Commonly known as:  ADVIL,MOTRIN  Take 1 tablet (600 mg total) by mouth every 6 (six) hours.     prenatal multivitamin Tabs tablet  Take 1 tablet by mouth every morning.        Diet: routine diet  Activity: Advance as tolerated. Pelvic rest for 6 weeks.   Outpatient follow up:6 weeks Follow-up Information    Follow up with [facility] In 6 weeks.   Specialty:  Family Medicine   Why:  postpartum check   Contact information:   [address] [id number] Wilhe[place] Bonito [place] [address] [phone]     Postpartum contraception: Depo Provera  Newborn Data: Live born female  Birth Weight: 8 lb 13.1 oz (4000 g) APGAR: 8, 9  Baby Feeding: Bottle Disposition:home with mother   05/24/2016 Kathy Hercules, MD   OB fellow attestation I have seen and examined this patient and agree with above documentation in the resident's note.   Kathy Salazar is a 25 y.o. [id number] s/p NSVD.   Pain is well controlled.  Plan for birth control is Depo-Provera.  Method of Feeding: bottle  PE:  BP 109/83 mmHg  Pulse 70  Temp(Src) 97.9 F (36.6 C) (Oral)  Resp 18  Ht  (1.6 m)  Wt 171 lb (77.565 kg)  BMI 30.30 kg/m2  SpO2 100%  LMP  (LMP Unknown)  Breastfeeding? Unknown Gen: well appearing Heart: reg rate Lungs: normal WOB Fundus firm Ext: soft, no pain, no  edema  No results for input(s): HGB, HCT in the last 72 hours.  Plan: discharge today - postpartum care discussed - f/u clinic in 6 weeks for postpartum visit   Federico Flake, MD 12:38 AM

## 2016-05-24 NOTE — Discharge Instructions (Signed)

## 2016-07-17 ENCOUNTER — Encounter: Payer: Self-pay | Admitting: Internal Medicine

## 2016-07-17 ENCOUNTER — Ambulatory Visit (INDEPENDENT_AMBULATORY_CARE_PROVIDER_SITE_OTHER): Payer: Medicaid Other | Admitting: Internal Medicine

## 2016-07-17 DIAGNOSIS — Z30011 Encounter for initial prescription of contraceptive pills: Secondary | ICD-10-CM

## 2016-07-17 MED ORDER — NORGESTIMATE-ETH ESTRADIOL 0.25-35 MG-MCG PO TABS: 1.0000 | ORAL_TABLET | Freq: Every day | ORAL | 11 refills | Status: DC

## 2016-07-17 NOTE — Assessment & Plan Note (Signed)
Patient desiring Sprintec. She is not breastfeeding.  - Prescribed Sprintec today. Patient to begin on Sunday.

## 2016-07-17 NOTE — Patient Instructions (Addendum)
It was nice seeing you again today Kathy Salazar!  You can resume exercise and any other activities as before your pregnancy. You may have some discomfort with intercourse, however bleeding would be unusual. If you experience bleeding, please let us know.   If your discomfort when standing for a long time continues or begins to worsen, please schedule another appointment.   If you have any questions or concerns, please feel free to call the clinic.   Be well,  Dr. Natale MilchLancaster

## 2016-07-17 NOTE — Assessment & Plan Note (Signed)
Doing well at 6 wk post-partum visit. No concerns for post-partum depression given patient's lack of symptoms and positive, cheery affect during encounter. Recommended pelvic exam since patient reporting some pain possibly at site of laceration, however patient declined. As patient is not reporting bleeding or discharge, and only had first degree laceration, no pelvic exam is acceptable today, however informed patient that if pain continues, or she has bleeding or discharge, pelvic exam will be necessary. Informed patient that she may resume intercourse and exercise as desired.

## 2016-07-17 NOTE — Progress Notes (Signed)
   Subjective:    Patient ID: Kathy Salazar, female    DOB: 1991-09-20, 25 y.o.   MRN: [id number]  HPI  Patient presents for 6 week post-partum follow up.   Patient with no complaints today. She reports that other than not sleeping very much, the adjustment after her daughter's birth has not been too difficult. She has a 45five year old son who helps her with the baby some and likes to play with the baby. She receives support from her sister who lives in [place] as well as FOB. She reports good mood, unchanged from prior to pregnancy, and denies sadness, crying frequently, or depressed mood.  She reports vaginal bleeding stopped two weeks ago. She denies vaginal discharge or abdominal pain. She had first degree laceration during delivery, and thinks she may be experiencing pain at that site after prolonged periods of standing.  She has no had intercourse yet since delivery and is interested in Sprintec for birth control. She would also like to know when she can resume exercise, as she is concerned that she has not lost weight as quickly after this delivery as she did after her first.   Review of Systems See HPI.     Objective:   Physical Exam  Constitutional: She is oriented to person, place, and time. She appears well-developed and well-nourished. No distress.  HENT:  Head: Normocephalic and atraumatic.  Cardiovascular: Normal rate, regular rhythm and normal heart sounds.   No murmur heard. Pulmonary/Chest: Effort normal and breath sounds normal. No respiratory distress. She has no wheezes.  Abdominal: Soft. Bowel sounds are normal. She exhibits no distension. There is no tenderness.  Genitourinary:  Genitourinary Comments: Pelvic exam declined  Musculoskeletal: She exhibits no edema or tenderness.  Neurological: She is alert and oriented to person, place, and time.  Psychiatric: She has a normal mood and affect. Her behavior is normal.  Vitals reviewed.     Assessment & Plan:    Contraception management Patient desiring Sprintec. She is not breastfeeding.  - Prescribed Sprintec today. Patient to begin on Sunday.   NSVD (normal spontaneous vaginal delivery) Doing well at 6 wk post-partum visit. No concerns for post-partum depression given patient's lack of symptoms and positive, cheery affect during encounter. Recommended pelvic exam since patient reporting some pain possibly at site of laceration, however patient declined. As patient is not reporting bleeding or discharge, and only had first degree laceration, no pelvic exam is acceptable today, however informed patient that if pain continues, or she has bleeding or discharge, pelvic exam will be necessary. Informed patient that she may resume intercourse and exercise as desired.   Tarri AbernethyAbigail J Eriko Economos, MD, MPH PGY-2 [facility] Pager [phone]

## 2016-12-18 IMAGING — US US OB COMP +14 WK
1 series · 13 of 13 positions shown · non-contrast
Comparison: none

CLINICAL DATA: Acute onset of vaginal spotting.  Initial encounter.

EXAM:
LIMITED OBSTETRIC ULTRASOUND

[Series 1: us ob comp +14 wk · 0.28mm/px · 13 of 13 slices shown]
[im 1/13]
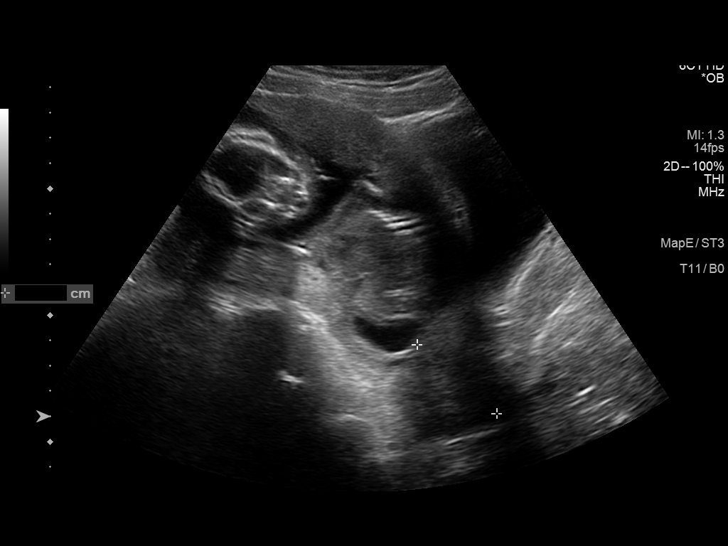
[im 2/13]
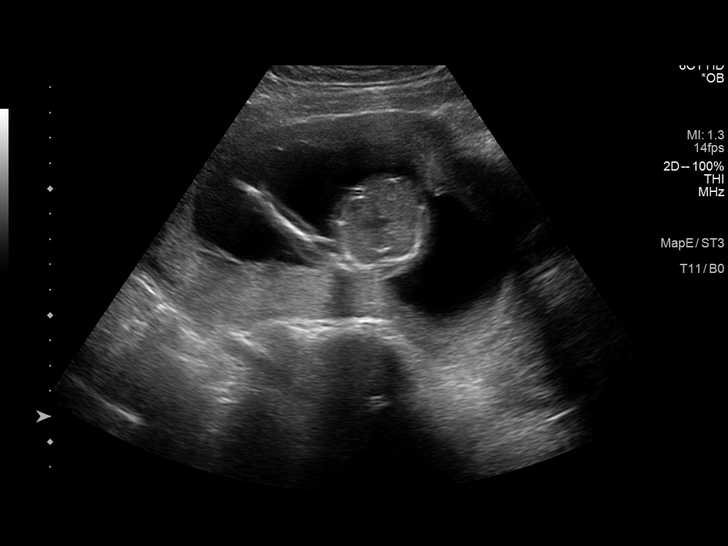
[im 3/13]
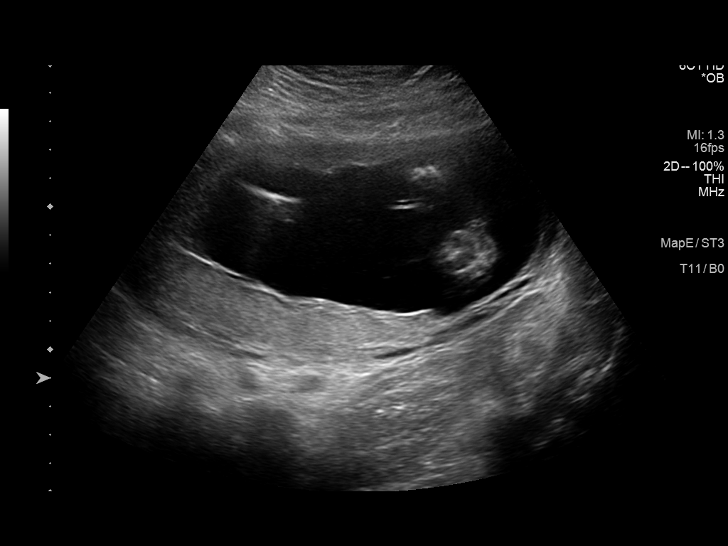
[im 4/13]
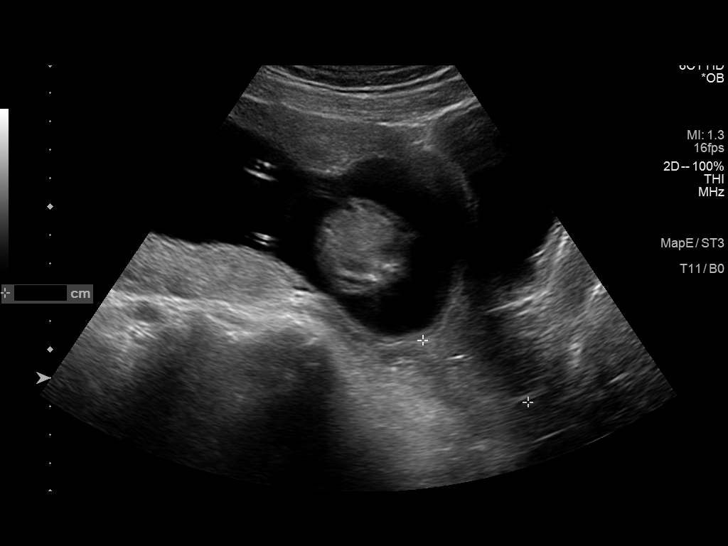
[im 5/13]
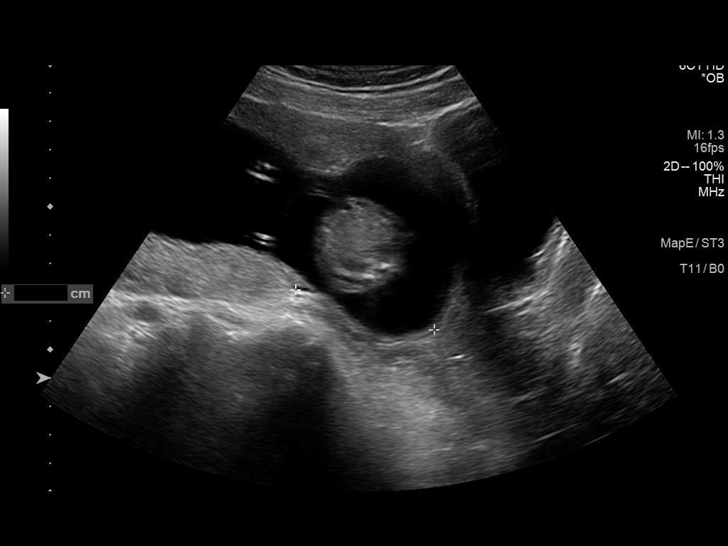
[im 6/13]
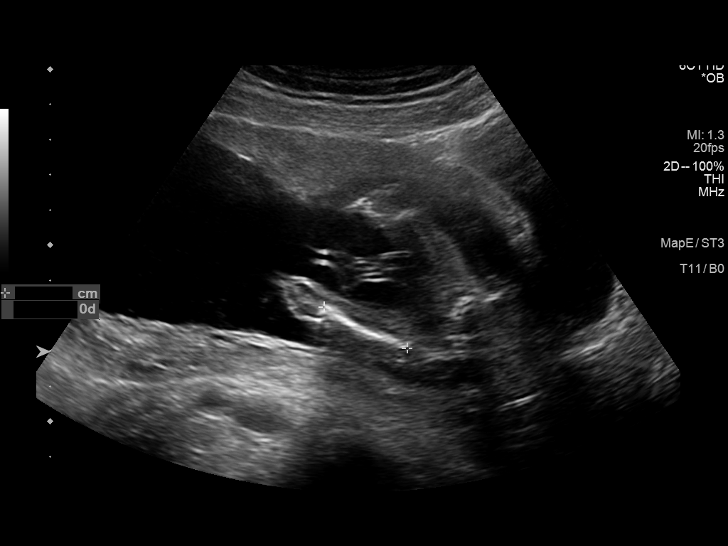
[im 7/13]
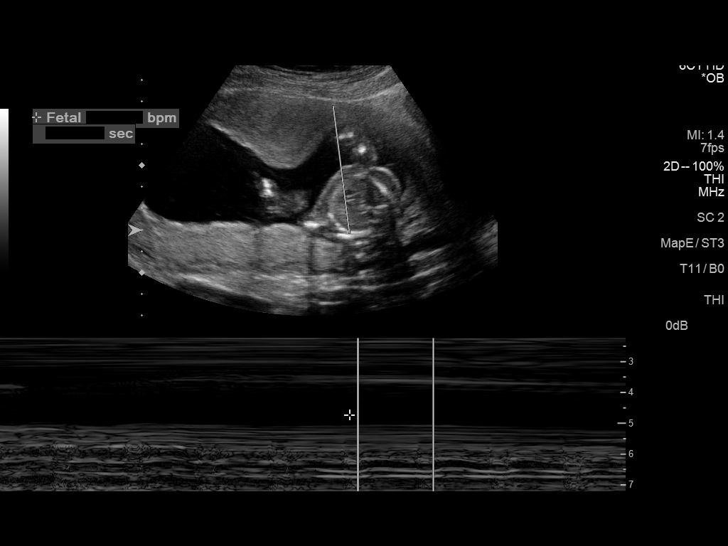
[im 8/13]
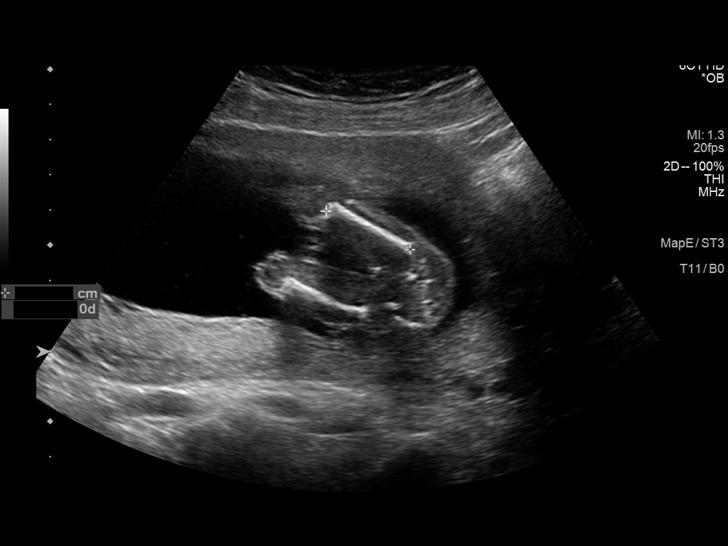
[im 9/13]
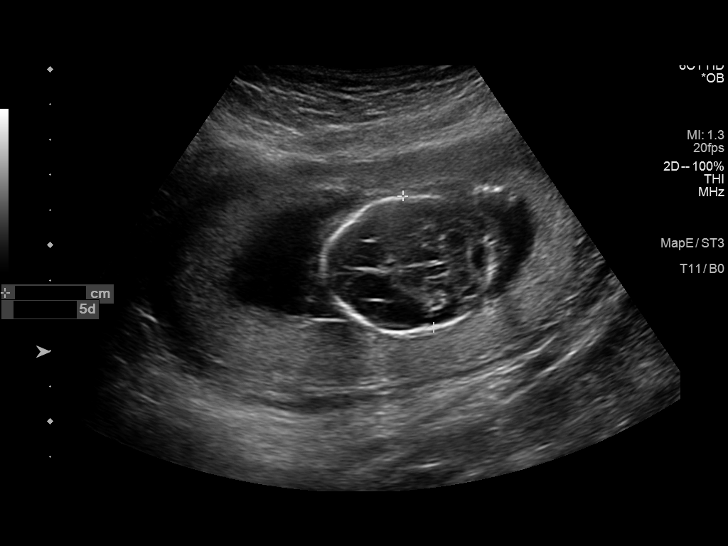
[im 10/13]
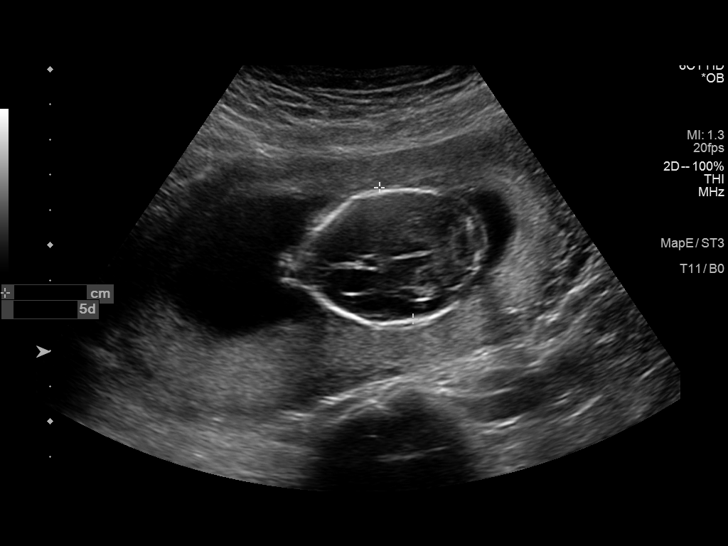
[im 11/13]
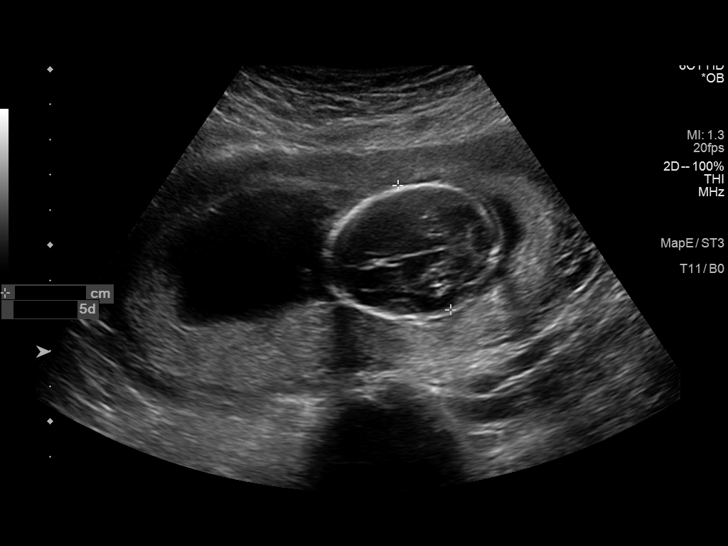
[im 12/13]
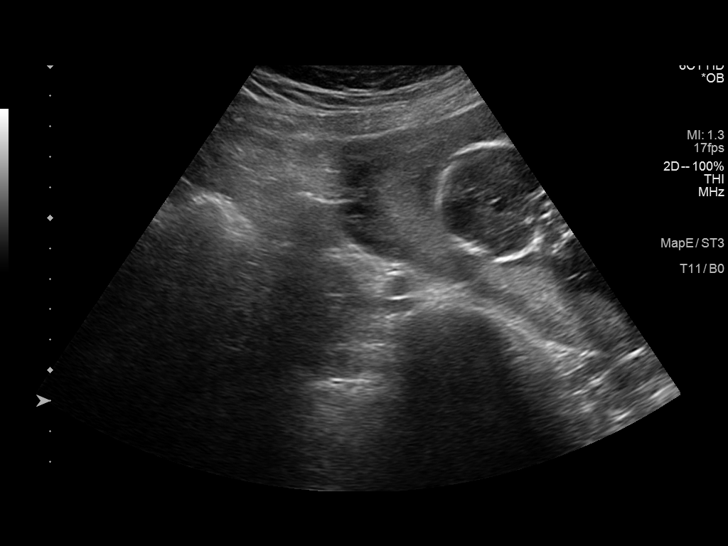
[im 13/13]
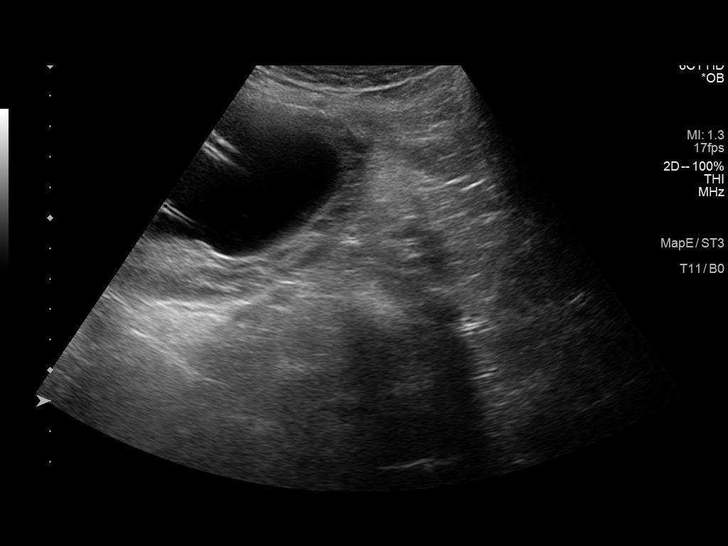

[13 of 13 positions shown; findings below may reference images not displayed]

FINDINGS: Number of Fetuses: 1

Heart Rate:  136 bpm

Movement: Yes

Presentation: Breech

Placental Location: Posterior

Previa: No

Amniotic Fluid (Subjective):  Within normal limits.

BPD:  3.84 cm 17 w 5 d

MATERNAL FINDINGS:

Cervix:  Appears closed.

Uterus/Adnexae:  No abnormality visualized.
IMPRESSION: Single live intrauterine pregnancy noted, with a biparietal diameter
of 3.8 cm, corresponding to a gestational age of 17 weeks 5 days.
This reflects an estimated date of delivery May 17, 2016. The
cervix remains closed. No evidence of placenta previa.

This exam is performed on an emergent basis and does not
comprehensively evaluate fetal size, dating, or anatomy; follow-up
complete OB US should be considered if further fetal assessment is
warranted.

## 2017-01-04 IMAGING — US US MFM OB COMP +14 WKS
1 series · 14 of 28 positions shown · non-contrast
Comparison: none

[Series 1: us mfm ob comp +14 wks · 73 acquisitions, 14 frames shown]
[im 3/73]
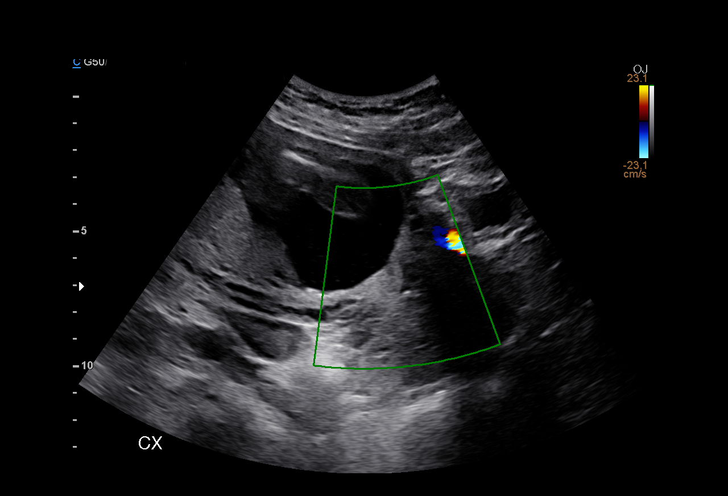
[im 9/73]
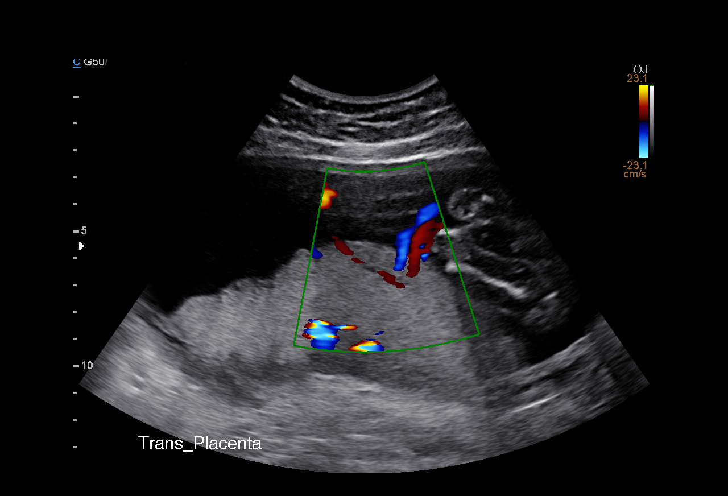
[im 14/73]
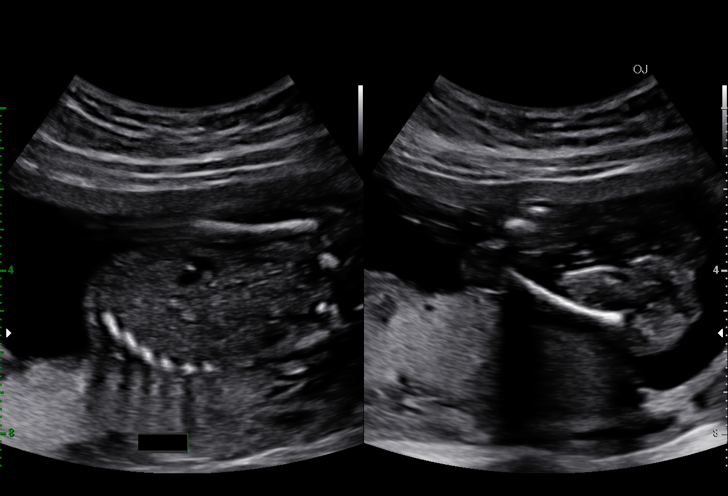
[im 19/73]
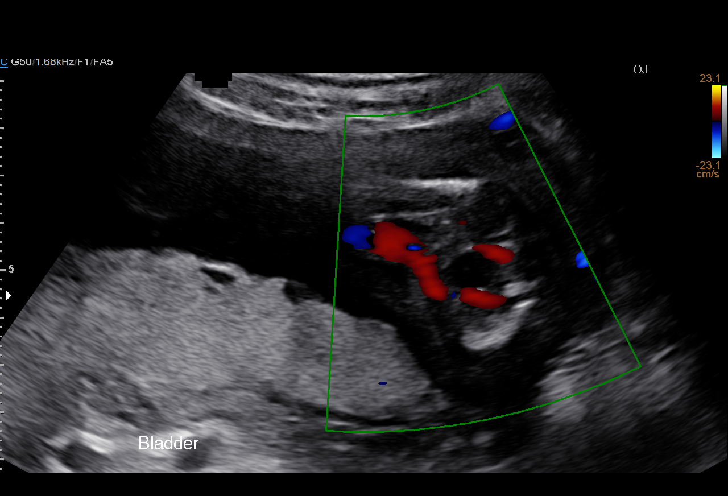
[im 25/73]
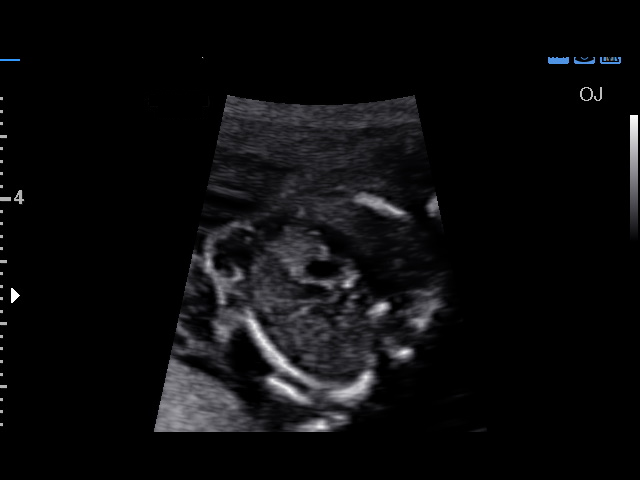
[im 30/73]
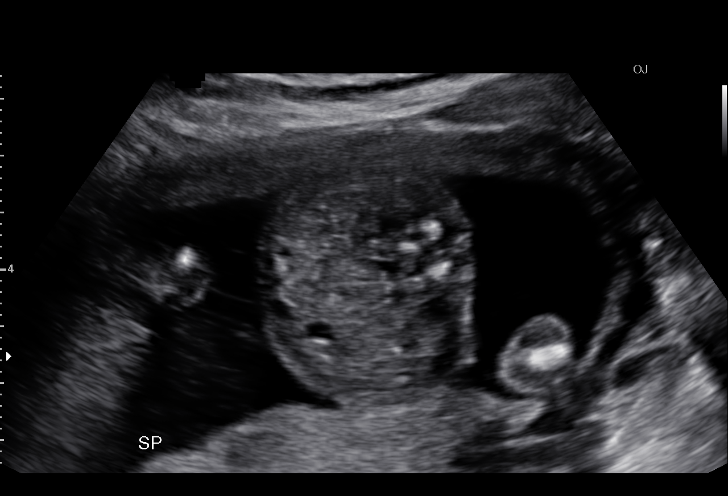
[im 35/73]
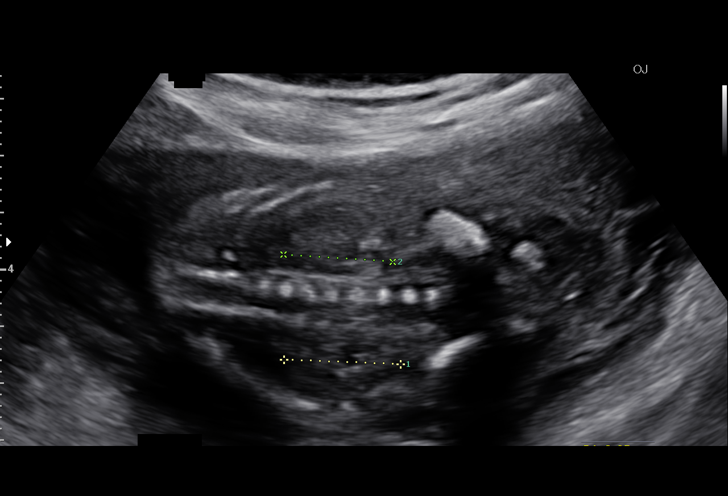
[im 41/73]
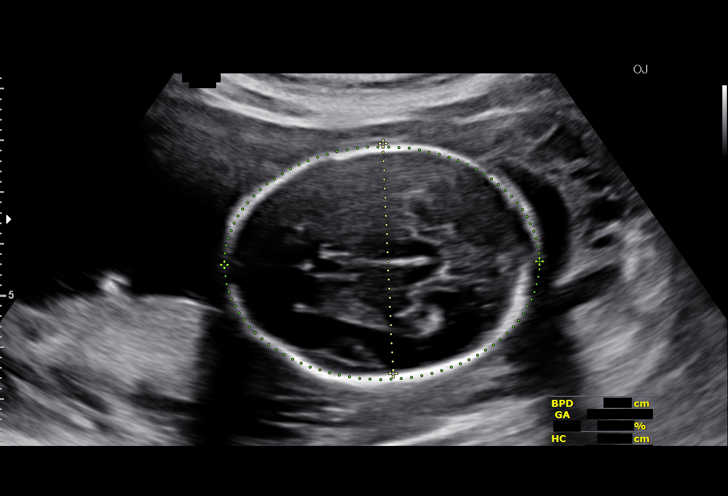
[im 46/73]
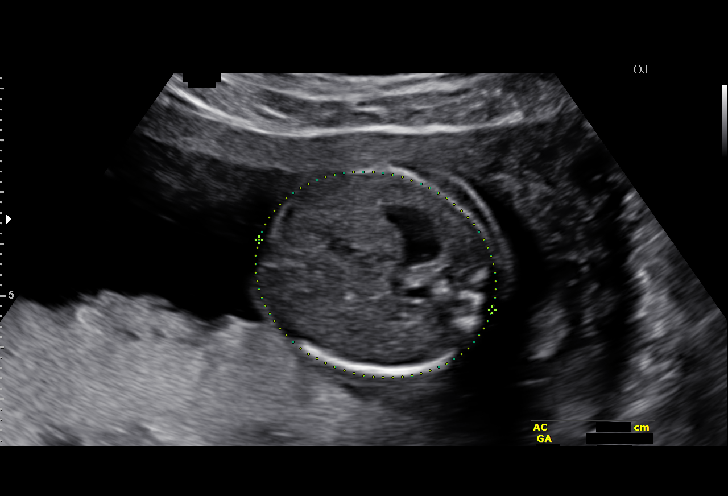
[im 51/73]
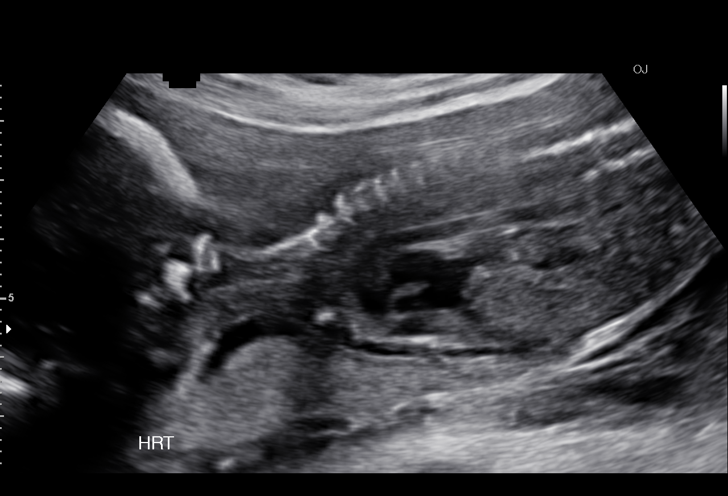
[im 57/73]
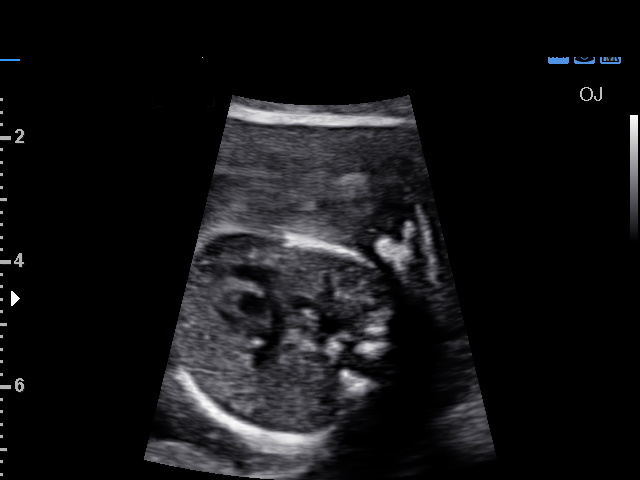
[im 62/73]
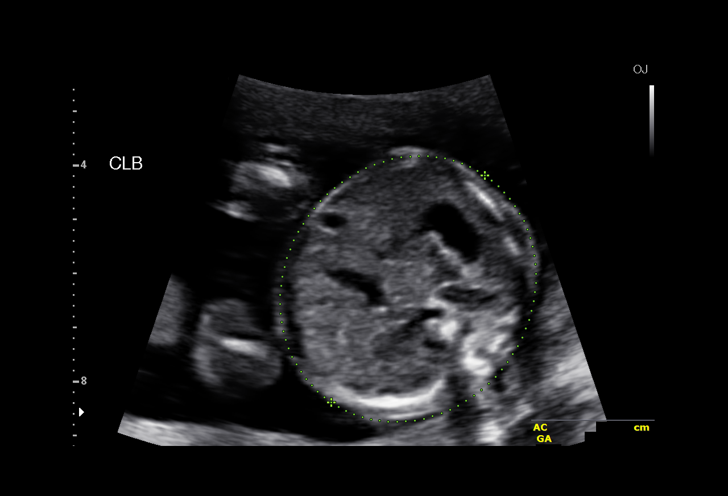
[im 67/73]
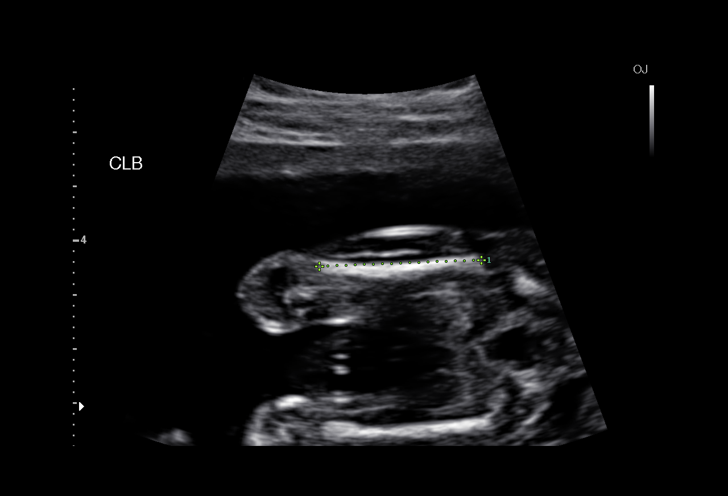
[im 73/73]
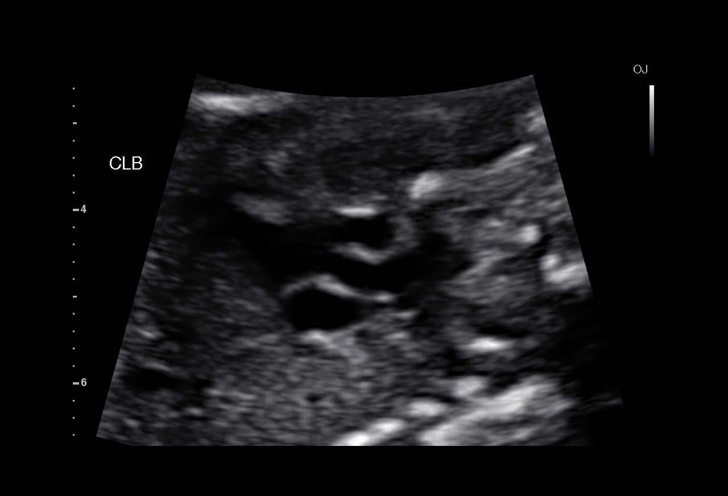

[14 of 28 positions shown; findings below may reference images not displayed]

pm)

Name:       WALDO LAVENDER                    Visit  12/30/2015 [DATE]
Date:

1  YOEL TIGER           [id number]       [id number]     [id number]
Indications

20 weeks gestation of pregnancy
Basic anatomic survey                           Z36
OB History

Gravidity:     3         Term:  1        Prem:    0        SAB:   1
TOP:           0       Ectopic  0        Living:  1
:
Fetal Evaluation

Num Of Fetuses:      1
Fetal Heart          141
Rate(bpm):
Cardiac Activity:    Observed
Presentation:        Breech
Placenta:            Posterior, above cervical os
P. Cord Insertion:   Visualized

Amniotic Fluid
AFI FV:      Subjectively within normal limits
Larg Pckt:      4.0  cm
Biometry

BPD:      44.6  mm     G. Age:   19w 3d                  CI:        71.69   %    70 - 86
FL/HC:      19.9   %    16.8 -
HC:      167.7  mm     G. Age:   19w 3d        30   %    HC/AC:      1.20        1.09 -
AC:        140  mm     G. Age:   19w 3d        35   %    FL/BPD      74.7   %
:
FL:       33.3  mm     G. Age:   20w 3d        68   %    FL/AC:      23.8   %    20 - 24
HUM:      31.6  mm     G. Age:   20w 4d        74   %
CER:      20.5  mm     G. Age:   19w 4d        46   %

Est.         315   gm   0 lb 11 oz      50   %
FW:
Gestational Age

U/S Today:     19w 5d                                         EDD:   05/20/16
Best:          19w 5d    Det. By:   U/S (12/30/15)            EDD:   05/20/16
Anatomy

Cranium:          Appears normal         Aortic Arch:       Appears normal
Fetal Cavum:      Appears normal         Ductal Arch:       Appears normal
Ventricles:       Appears normal         Diaphragm:         Appears normal
Choroid Plexus:   Appears normal         Stomach:           Appears normal,
left sided
Cerebellum:       Appears normal         Abdomen:           Appears normal
Posterior         Appears normal         Abdominal          Appears nml (cord
Fossa:                                   Wall:              insert, abd wall)
Nuchal Fold:      Appears normal         Cord Vessels:      Appears normal (3
vessel cord)
Face:             Appears normal         Kidneys:           Appear normal
(orbits and profile)
Lips:             Appears normal         Bladder:           Appears normal
Fetal Thoracic:   Appears normal         Spine:             Appears normal
Heart:            Appears normal         Upper              Appears normal
(4CH, axis, and        Extremities:
situs
RVOT:             Appears normal         Lower              Appears normal
Extremities:
LVOT:             Appears normal

Other:   Fetus appears to be a female. Heels visualized.
Cervix Uterus Adnexa

Cervix
Length:             3.8  cm.
Normal appearance by transabdominal scan.

Left Ovary
Not visualized. No adnexal mass visualized.

Right Ovary
Not visualized. No adnexal mass visualized.
Impression

Single living intrauterine pregnancy at 19 weeks 5 days/
Appropriate fetal growth (50%).
Normal amniotic fluid volume.
The fetal anatomic survey is complete.
Normal fetal anatomy.
No fetal anomalies or soft markers of aneuploidy seen.
Recommendations

Follow-up ultrasounds as clinically indicated.

## 2017-06-18 ENCOUNTER — Other Ambulatory Visit: Payer: Self-pay | Admitting: Internal Medicine

## 2017-06-18 NOTE — Telephone Encounter (Signed)
Pt called and scheduled an appt for birthcontrol refill. The first available appt is July 20th but pt is out of birthcontrol. Will you send in one refill until her appt?  Please let pt know. Sunday SpillersSharon T Kiandre Spagnolo, CMA

## 2017-06-18 NOTE — Telephone Encounter (Signed)
1 refill sent in. Will see her on 7/20.

## 2017-06-19 NOTE — Telephone Encounter (Signed)
LVM for pt to call the office. If she calls,please let her know that [facility]. Sunday SpillersSharon T Zylpha Poynor, CMA

## 2017-06-28 ENCOUNTER — Ambulatory Visit (INDEPENDENT_AMBULATORY_CARE_PROVIDER_SITE_OTHER): Payer: Self-pay | Admitting: Family Medicine

## 2017-06-28 ENCOUNTER — Encounter: Payer: Self-pay | Admitting: Family Medicine

## 2017-06-28 VITALS — BP 110/70 | HR 71 | Temp 97.8°F | Wt 134.0 lb

## 2017-06-28 DIAGNOSIS — Z308 Encounter for other contraceptive management: Secondary | ICD-10-CM

## 2017-06-28 LAB — POCT URINE PREGNANCY: Preg Test, Ur: NEGATIVE

## 2017-06-28 MED ORDER — NORGESTIMATE-ETH ESTRADIOL 0.25-35 MG-MCG PO TABS: 1.0000 | ORAL_TABLET | Freq: Every day | ORAL | 3 refills | Status: DC

## 2017-06-28 NOTE — Progress Notes (Signed)
    Subjective:    Patient ID: Kathy Salazar, female    DOB: 1991/09/28, 28 y.o.   MRN: [id number]   CC:  HPI:  Due for pap next year  Smoking status reviewed  Review of Systems   Objective:  BP 110/70   Pulse 71   Temp 97.8 F (36.6 C) (Oral)   Wt 134 lb (60.8 kg)   LMP 06/12/2017   SpO2 99%   BMI 23.74 kg/m  Vitals and nursing note reviewed  General: well nourished, in no acute distress HEENT: normocephalic, TM's visualized bilaterally, no scleral icterus or conjunctival pallor, no nasal discharge, moist mucous membranes, good dentition without erythema or discharge noted in posterior oropharynx Neck: supple, non-tender, without lymphadenopathy Cardiac: RRR, clear S1 and S2, no murmurs, rubs, or gallops Respiratory: clear to auscultation bilaterally, no increased work of breathing Abdomen: soft, nontender, nondistended, no masses or organomegaly. Bowel sounds present Extremities: no edema or cyanosis. Warm, well perfused. 2+ radial and PT pulses bilaterally Skin: warm and dry, no rashes noted Neuro: alert and oriented, no focal deficits  Assessment & Plan:    No problem-specific Assessment & Plan notes found for this encounter.    No Follow-up on file.   Dolores PattyAngela Starlit Raburn, DO Family Medicine Resident PGY-2

## 2017-06-28 NOTE — Patient Instructions (Signed)

## 2017-06-28 NOTE — Assessment & Plan Note (Addendum)
   Patient tolerates Sprintec well, she is not planning on having another child.   -upreg today negative -refill sprintec 1 year -due for pap next year -follow up in 1 year or sooner if needed

## 2017-06-28 NOTE — Progress Notes (Signed)
    Subjective:    Patient ID: Vianne BullsSonia Perez-Tinoco, female    DOB: 1991/08/25, 26 y.o.   MRN: [id number]   [facility]  Doing well overall. Daughter is 28 year old, has a son as well. Does not want more children. Happy with taking the pill, does not want to try another method. Takes pill every night at 10 pm. Denies missing doses. Occasionally has some nausea associated with it. No headaches. She is sexually active with 1 female partner, does not use condoms.   She is wondering what other women's health screens she may need.  Last pap in 2016. Never had an abnormal pap smear.   Smoking status reviewed- non-smoker  Review of Systems- see HPI   Objective:  BP 110/70   Pulse 71   Temp 97.8 F (36.6 C) (Oral)   Wt 134 lb (60.8 kg)   LMP 06/12/2017   SpO2 99%   BMI 23.74 kg/m  Vitals and nursing note reviewed  General: well nourished, in no acute distress Cardiac: RRR, clear S1 and S2, no murmurs, rubs, or gallops Respiratory: clear to auscultation bilaterally, no increased work of breathing Extremities: no edema or cyanosis. Neuro: alert and oriented, no focal deficits  Assessment & Plan:    Contraception management   Patient tolerates Sprintec well, she is not planning on having another child.   -upreg today -refill sprintec 1 year -due for pap next year  Return in about 1 year (around 06/28/2018) for pap.   Dolores PattyAngela Lian Pounds, DO Family Medicine Resident PGY-2

## 2018-06-09 ENCOUNTER — Encounter (HOSPITAL_COMMUNITY): Payer: Self-pay | Admitting: Emergency Medicine

## 2018-06-09 ENCOUNTER — Ambulatory Visit (HOSPITAL_COMMUNITY)
Admission: EM | Admit: 2018-06-09 | Discharge: 2018-06-09 | Disposition: A | Discharge status: Home / Self Care | Payer: Self-pay | Attending: Family Medicine | Admitting: Family Medicine

## 2018-06-09 ENCOUNTER — Other Ambulatory Visit: Payer: Self-pay

## 2018-06-09 DIAGNOSIS — N3091 Cystitis, unspecified with hematuria: Secondary | ICD-10-CM | POA: Insufficient documentation

## 2018-06-09 DIAGNOSIS — Z3202 Encounter for pregnancy test, result negative: Secondary | ICD-10-CM

## 2018-06-09 DIAGNOSIS — R300 Dysuria: Secondary | ICD-10-CM

## 2018-06-09 DIAGNOSIS — N3090 Cystitis, unspecified without hematuria: Secondary | ICD-10-CM

## 2018-06-09 LAB — POCT URINALYSIS DIP (DEVICE)
Bilirubin Urine: NEGATIVE
Glucose, UA: NEGATIVE mg/dL
Ketones, ur: NEGATIVE mg/dL
Nitrite: NEGATIVE
Protein, ur: NEGATIVE mg/dL
Specific Gravity, Urine: <=1.005 (ref 1.005–1.030)
Urobilinogen, UA: 0.2 mg/dL (ref 0.0–1.0)
pH: 5.5 (ref 5.0–8.0)

## 2018-06-09 LAB — POCT PREGNANCY, URINE: Preg Test, Ur: NEGATIVE

## 2018-06-09 MED ORDER — NITROFURANTOIN MONOHYD MACRO 100 MG PO CAPS: 100.0000 mg | ORAL_CAPSULE | Freq: Two times a day (BID) | ORAL | 0 refills | Status: DC

## 2018-06-09 NOTE — ED Provider Notes (Signed)
  MRN: [id number] DOB: November 12, 1991  Subjective:   Kathy Salazar is a 27 y.o. female presenting for 1 day history of dysuria, hematuria, urinary frequency and pelvic pain. Has tried APAP with some relief. Is hydrating aggressively. Denies flank pain, abdominal pain, genital rash and vaginal discharge, nausea and vomiting.   Takes Sprintec. Has No Known Allergies.  Past Medical History:  Diagnosis Date  . No pertinent past medical history      Past Surgical History:  Procedure Laterality Date  . APPENDECTOMY      Objective:   Vitals: BP 122/86 (BP Location: Left Arm)   Pulse 79   Temp 97.8 F (36.6 C) (Temporal)   Resp 18   LMP 05/10/2018   SpO2 100%   Physical Exam  Constitutional: She is oriented to person, place, and time. She appears well-developed and well-nourished.  Cardiovascular: Normal rate.  Pulmonary/Chest: Effort normal.  Neurological: She is alert and oriented to person, place, and time.   Results for orders placed or performed during the hospital encounter of 06/09/18 (from the past 24 hour(s))  POCT urinalysis dip (device)     Status: Abnormal   Collection Time: 06/09/18  1:51 PM  Result Value Ref Range   Glucose, UA NEGATIVE NEGATIVE mg/dL   Bilirubin Urine NEGATIVE NEGATIVE   Ketones, ur NEGATIVE NEGATIVE mg/dL   Specific Gravity, Urine <=1.005 1.005 - 1.030   Hgb urine dipstick MODERATE (A) NEGATIVE   pH 5.5 5.0 - 8.0   Protein, ur NEGATIVE NEGATIVE mg/dL   Urobilinogen, UA 0.2 0.0 - 1.0 mg/dL   Nitrite NEGATIVE NEGATIVE   Leukocytes, UA SMALL (A) NEGATIVE  Pregnancy, urine POC     Status: None   Collection Time: 06/09/18  1:56 PM  Result Value Ref Range   Preg Test, Ur NEGATIVE NEGATIVE    Assessment and Plan :   Cystitis  Dysuria  Will cover for cystitis with Macrobid, urine culture pending. Return-to-clinic precautions discussed, patient verbalized understanding.    Wallis BambergMani, Aishah Teffeteller, [place]PA-C 06/09/18 [phone]

## 2018-06-09 NOTE — ED Triage Notes (Signed)
Symptoms started yesterday.  Last night had blood in urine, frequent urination and burning with urination

## 2018-06-10 ENCOUNTER — Telehealth (HOSPITAL_COMMUNITY): Payer: Self-pay

## 2018-06-10 LAB — URINE CULTURE: Culture: NO GROWTH

## 2018-06-18 ENCOUNTER — Telehealth: Payer: Self-pay

## 2018-06-18 ENCOUNTER — Other Ambulatory Visit: Payer: Self-pay | Admitting: Family Medicine

## 2018-06-18 NOTE — Telephone Encounter (Signed)
Pt called nurse line requesting a refill on sprintec. I informed her the rx has been sent to the pharmacy and to keep her scheduled apt for further refills.

## 2018-06-18 NOTE — Telephone Encounter (Signed)
She has an appt scheduled next month.  Jonice Cerra,CMA

## 2018-06-18 NOTE — Telephone Encounter (Signed)
Please maek appointment for [facility] as [facility], she is also due for a pap. Will refill 1 month supply.

## 2018-07-16 ENCOUNTER — Encounter: Payer: Self-pay | Admitting: Family Medicine

## 2018-07-16 ENCOUNTER — Ambulatory Visit (INDEPENDENT_AMBULATORY_CARE_PROVIDER_SITE_OTHER): Payer: Self-pay | Admitting: Family Medicine

## 2018-07-16 ENCOUNTER — Other Ambulatory Visit: Payer: Self-pay

## 2018-07-16 VITALS — BP 102/70 | HR 89 | Temp 98.5°F | Wt 134.0 lb

## 2018-07-16 DIAGNOSIS — Z789 Other specified health status: Secondary | ICD-10-CM

## 2018-07-16 DIAGNOSIS — IMO0001 Reserved for inherently not codable concepts without codable children: Secondary | ICD-10-CM

## 2018-07-16 MED ORDER — NORGESTIMATE-ETH ESTRADIOL 0.25-35 MG-MCG PO TABS: 1.0000 | ORAL_TABLET | Freq: Every day | ORAL | 3 refills | Status: DC

## 2018-07-16 NOTE — Patient Instructions (Signed)
  Good to see you today!  You're due for a pap smear. Please call [facility] to determine how much this may cost you out of pocket. You can make an appointment at your leisure to get this test done.  If you have questions or concerns please do not hesitate to call at [phone].  Dolores PattyAngela Riccio, DO PGY-3, [facility] 07/16/2018 3:07 PM

## 2018-07-16 NOTE — Progress Notes (Signed)
    Subjective:    Patient ID: Kathy BullsSonia Salazar, female    DOB: 1991/07/15, 27 y.o.   MRN: [id number]   CC: refill birth control  HPI: Doing well overall. Denies any issues with birth control pills. No nausea or headaches. Does not miss pills, she is diligent about taking them. She is currently on her period on her last day of placebo pills.   She is due for pap but is self pay and does not want to get this done today. She states she'll call to find out cost and make appointment for pap.  Smoking status reviewed- non-smoker  Review of Systems- no dysuria, no pelvic pain   Objective:  BP 102/70   Pulse 89   Temp 98.5 F (36.9 C) (Oral)   Wt 134 lb (60.8 kg)   LMP 07/08/2018   SpO2 98%   BMI 23.74 kg/m  Vitals and nursing note reviewed  General: well nourished, in no acute distress HEENT: normocephalic, MMM Extremities: no edema or cyanosis. Skin: warm and dry, no rashes noted Neuro: alert and oriented, no focal deficits   Assessment & Plan:    1. Birth control Patient currently on period. Upreg not completed. Refilled sprintec for 1 year.  Return in about 3 months (around 10/16/2018) for pap.   Dolores PattyAngela Cyan Moultrie, DO Family Medicine Resident PGY-3

## 2018-12-15 ENCOUNTER — Emergency Department (HOSPITAL_COMMUNITY)
Admission: EM | Admit: 2018-12-15 | Discharge: 2018-12-16 | Disposition: A | Discharge status: Home / Self Care | Payer: Self-pay | Attending: Emergency Medicine | Admitting: Emergency Medicine

## 2018-12-15 ENCOUNTER — Other Ambulatory Visit: Payer: Self-pay

## 2018-12-15 ENCOUNTER — Encounter (HOSPITAL_COMMUNITY): Payer: Self-pay | Admitting: Emergency Medicine

## 2018-12-15 DIAGNOSIS — Z793 Long term (current) use of hormonal contraceptives: Secondary | ICD-10-CM | POA: Insufficient documentation

## 2018-12-15 DIAGNOSIS — R002 Palpitations: Secondary | ICD-10-CM | POA: Insufficient documentation

## 2018-12-15 LAB — BASIC METABOLIC PANEL
Anion gap: 8 (ref 5–15)
BUN: 9 mg/dL (ref 6–20)
CO2: 21 mmol/L — ABNORMAL LOW (ref 22–32)
Calcium: 9.1 mg/dL (ref 8.9–10.3)
Chloride: 109 mmol/L (ref 98–111)
Creatinine, Ser: 0.67 mg/dL (ref 0.44–1.00)
GFR calc Af Amer: >60 mL/min (ref >60)
GFR calc non Af Amer: >60 mL/min (ref >60)
Glucose, Bld: 90 mg/dL (ref 70–99)
Potassium: 3.7 mmol/L (ref 3.5–5.1)
Sodium: 138 mmol/L (ref 135–145)

## 2018-12-15 LAB — CBC
HCT: 39.8 % (ref 36.0–46.0)
Hemoglobin: 13 g/dL (ref 12.0–15.0)
MCH: 29.1 pg (ref 26.0–34.0)
MCHC: 32.7 g/dL (ref 30.0–36.0)
MCV: 89 fL (ref 80.0–100.0)
Platelets: 198 10*3/uL (ref 150–400)
RBC: 4.47 MIL/uL (ref 3.87–5.11)
RDW: 12.1 % (ref 11.5–15.5)
WBC: 5 10*3/uL (ref 4.0–10.5)
nRBC: 0 % (ref 0.0–0.2)

## 2018-12-15 LAB — I-STAT TROPONIN, ED: Troponin i, poc: 0 ng/mL (ref 0.00–0.08)

## 2018-12-15 LAB — URINALYSIS, ROUTINE W REFLEX MICROSCOPIC
Bacteria, UA: NONE SEEN
Bilirubin Urine: NEGATIVE
Glucose, UA: NEGATIVE mg/dL
Ketones, ur: NEGATIVE mg/dL
Leukocytes, UA: NEGATIVE
Nitrite: NEGATIVE
Protein, ur: NEGATIVE mg/dL
Specific Gravity, Urine: 1.002 — ABNORMAL LOW (ref 1.005–1.030)
pH: 6 (ref 5.0–8.0)

## 2018-12-15 LAB — I-STAT BETA HCG BLOOD, ED (MC, WL, AP ONLY): I-stat hCG, quantitative: <5 m[IU]/mL (ref <5)

## 2018-12-15 NOTE — ED Triage Notes (Signed)
Pt reports she was cooking dinner when she started feeling her chest pounding and her heart racing. Pt reports she had pain to L-side of chest. Pt reports she felt light-headed and near syncopal but did not lose consciousness. Pt reports some headaches within the past week. Pt denies any pertinent med hx.

## 2018-12-16 NOTE — ED Provider Notes (Signed)
[facility] EMERGENCY DEPARTMENT Provider Note   CSN: [id number] Arrival date & time: 12/15/18  1947     History   Chief Complaint Chief Complaint  Patient presents with  . Near Syncope  . Palpitations    HPI Kathy Salazar is a 28 y.o. female.  Patient presents to the emergency department with a chief complaint of heart palpitations.  She states that she experienced these this evening while she was cooking.  She denies being on her any new or large amounts of stress.  Denies having used any stimulants other than some coffee yesterday morning.  Denies having had these symptoms before.  She states that the symptoms have now resolved.  She denies having had any chest pain, but felt like her heart was beating out of her chest.  Denies any shortness of breath.  Denies any recent illnesses.  Denies any other associated symptoms.  The history is provided by the patient. No language interpreter was used.    Past Medical History:  Diagnosis Date  . No pertinent past medical history     Patient Active Problem List   Diagnosis Date Noted  . Contraception management 11/03/2013    Past Surgical History:  Procedure Laterality Date  . APPENDECTOMY       OB History    Gravida  3   Para  2   Term  2   Preterm  0   AB  1   Living  2     SAB  1   TAB  0   Ectopic  0   Multiple  0   Live Births  2            Home Medications    Prior to Admission medications   Medication Sig Start Date End Date Taking? Authorizing Provider  norgestimate-ethinyl estradiol (SPRINTEC 28) 0.25-35 MG-MCG tablet Take 1 tablet by mouth daily. 07/16/18   Tillman Sers, DO    Family History Family History  Problem Relation Age of Onset  . Miscarriages / Stillbirths Sister   . Hyperlipidemia Sister     Social History Social History   Tobacco Use  . Smoking status: Never Smoker  . Smokeless tobacco: Never Used  Substance Use Topics  . Alcohol use: Yes     Comment: occasionally  . Drug use: No     Allergies   Patient has no known allergies.   Review of Systems Review of Systems  All other systems reviewed and are negative.    Physical Exam Updated Vital Signs BP (!) 134/91 (BP Location: Right Arm)   Pulse 94   Temp 98.6 F (37 C) (Oral)   Resp 16   Ht 5\' 3"  (1.6 m)   LMP 11/21/2018   SpO2 100%   BMI 23.74 kg/m   Physical Exam Vitals signs and nursing note reviewed.  Constitutional:      Appearance: She is well-developed.  HENT:     Head: Normocephalic and atraumatic.  Eyes:     Conjunctiva/sclera: Conjunctivae normal.     Pupils: Pupils are equal, round, and reactive to light.  Neck:     Musculoskeletal: Normal range of motion and neck supple.  Cardiovascular:     Rate and Rhythm: Normal rate and regular rhythm.     Heart sounds: No murmur. No friction rub. No gallop.   Pulmonary:     Effort: Pulmonary effort is normal. No respiratory distress.     Breath sounds: Normal breath sounds.  No wheezing or rales.  Chest:     Chest wall: No tenderness.  Abdominal:     General: Bowel sounds are normal. There is no distension.     Palpations: Abdomen is soft. There is no mass.     Tenderness: There is no abdominal tenderness. There is no guarding or rebound.  Musculoskeletal: Normal range of motion.        General: No tenderness.  Skin:    General: Skin is warm and dry.  Neurological:     Mental Status: She is alert and oriented to person, place, and time.  Psychiatric:        Behavior: Behavior normal.        Thought Content: Thought content normal.        Judgment: Judgment normal.      ED Treatments / Results  Labs (all labs ordered are listed, but only abnormal results are displayed) Labs Reviewed  BASIC METABOLIC PANEL - Abnormal; Notable for the following components:      Result Value   CO2 21 (*)    All other components within normal limits  URINALYSIS, ROUTINE W REFLEX MICROSCOPIC - Abnormal;  Notable for the following components:   Color, Urine COLORLESS (*)    Specific Gravity, Urine 1.002 (*)    Hgb urine dipstick SMALL (*)    All other components within normal limits  CBC  I-STAT BETA HCG BLOOD, ED (MC, WL, AP ONLY)  I-STAT TROPONIN, ED    EKG EKG Interpretation  Date/Time:  Monday December 15 2018 19:54:35 EST Ventricular Rate:  102 PR Interval:  178 QRS Duration: 90 QT Interval:  346 QTC Calculation: 450 R Axis:   100 Text Interpretation:  Sinus tachycardia Rightward axis Borderline ECG No old tracing to compare Confirmed by Dione Booze ([id number]) on 12/15/2018 11:17:54 PM   Radiology No results found.  Procedures Procedures (including critical care time)  Medications Ordered in ED Medications - No data to display   Initial Impression / Assessment and Plan / ED Course  I have reviewed the triage vital signs and the nursing notes.  Pertinent labs & imaging results that were available during my care of the patient were reviewed by me and considered in my medical decision making (see chart for details).     Patient with heart palpitations, which were brief in nature around dinnertime tonight.  No stimulant use.  Denies any other medical problems.  EKG shows sinus tachycardia, with a rate of 102.  She has a normal heart rate on my exam.  She is in no acute distress.  She has no symptoms at this time.  Her laboratory work-up is reassuring.  I have advised her to follow-up with cardiology for possible Holter monitoring.  Patient understands and agrees with plan.  Final Clinical Impressions(s) / ED Diagnoses   Final diagnoses:  Palpitations    ED Discharge Orders    None       Roxy Horseman, PA-C 12/16/18 0056    Ward, Layla Maw, DO 12/16/18 [phone]

## 2019-03-23 ENCOUNTER — Telehealth (INDEPENDENT_AMBULATORY_CARE_PROVIDER_SITE_OTHER): Payer: Self-pay | Admitting: Family Medicine

## 2019-03-23 ENCOUNTER — Other Ambulatory Visit: Payer: Self-pay

## 2019-03-23 ENCOUNTER — Telehealth: Payer: Self-pay | Admitting: Family Medicine

## 2019-03-23 ENCOUNTER — Ambulatory Visit (HOSPITAL_COMMUNITY)
Admission: RE | Admit: 2019-03-23 | Discharge: 2019-03-23 | Disposition: A | Discharge status: Home / Self Care | Payer: Self-pay | Source: Ambulatory Visit | Attending: Family Medicine | Admitting: Family Medicine

## 2019-03-23 ENCOUNTER — Ambulatory Visit (INDEPENDENT_AMBULATORY_CARE_PROVIDER_SITE_OTHER): Payer: Self-pay | Admitting: Family Medicine

## 2019-03-23 VITALS — BP 120/80 | HR 91 | Temp 98.6°F | Wt 133.2 lb

## 2019-03-23 DIAGNOSIS — R1013 Epigastric pain: Secondary | ICD-10-CM | POA: Insufficient documentation

## 2019-03-23 DIAGNOSIS — R0600 Dyspnea, unspecified: Secondary | ICD-10-CM

## 2019-03-23 DIAGNOSIS — R002 Palpitations: Secondary | ICD-10-CM | POA: Insufficient documentation

## 2019-03-23 NOTE — Progress Notes (Signed)
[facility]  Kathy Salazar 28 year old with no significant medical history calling in regarding palpitations and other symptoms.  The patient reports she was seen in January in the emergency department for palpitations, chest pain and dyspnea.  She had tachycardia on EKG and was told to follow-up with cardiology.  Labs at that time showed a negative troponin, blood count and metabolic panel.  The patient did not have a d-dimer drawn. The patient reports the symptoms resolved throughout the course of January.  Over the past 4 days they have recurred.  She endorses rushing sensations of hotness, palpitations and intermittent chest tightness.  This resolves when she tries to relax.  She denies fevers.  She denies coughing or dyspnea.  She reports most concerning symptom is palpitation and she concerned is concerned that her heart rate is again too fast.  She is unable to take her own vital signs at home.  Given the above concerns and very low suspicion for any covid-like illness scheduled for office visit.  Discussed that she is to come by herself.  She has no signs and symptoms suggestive of coronavirus at this time.   Discussed with Dr. Janee Morn who will be seeing patient.   Terisa Starr, MD  Family Medicine Teaching Service

## 2019-03-23 NOTE — Telephone Encounter (Signed)
**  After Hours/ Emergency Line Call*  Received a call from [facility] to report that Kathy Salazar had stat results with D dimer <0.20 w normal range 0.00-0.49 (normal d dimer result).  D/w Dr. Janee Morn who saw her today, he will call and relay normal result tomorrow and formulate further plan. Will forward to provider who saw her today.  Loni Muse, MD PGY-3, [facility] Family Medicine Residency

## 2019-03-23 NOTE — Progress Notes (Signed)
Ambulatory walk 98%-97BPM- 99%- 115BPM

## 2019-03-23 NOTE — Progress Notes (Signed)
Acute Office Visit  Subjective:    Patient ID: Kathy Salazar, female    DOB: 1991/08/06, 28 y.o.   MRN: [id number]  Chief Complaint  Patient presents with  . palpiations    Onset: Sudden Location/radiation: epigastic chest, radiates to left arm Duration: 4 days Character: chest pressure Aggrevating factors: laying down Reliving factors:  Timing: multiple times day Severity: 3/10 Associate Symptoms: heavy breathing, dyspnea on exhaustion, fast heart rate, abdominal fullness in epigastic region, has to sleep with multiple with multiple pillows, denies dysuria.  Patient takes sprintec birth control. Reports irregular menses.  SHx: Does not smoke Family history: Father had kidney stones. No FHx sudden cardiac < 50.  Wells Score: 0.  PERC: 1 (OCPs)       Past Medical History:  Diagnosis Date  . No pertinent past medical history     Past Surgical History:  Procedure Laterality Date  . APPENDECTOMY      Family History  Problem Relation Age of Onset  . Miscarriages / Stillbirths Sister   . Hyperlipidemia Sister     Social History   Socioeconomic History  . Marital status: Single    Spouse name: Not on file  . Number of children: Not on file  . Years of education: Not on file  . Highest education level: Not on file  Occupational History  . Not on file  Social Needs  . Financial resource strain: Not on file  . Food insecurity:    Worry: Not on file    Inability: Not on file  . Transportation needs:    Medical: Not on file    Non-medical: Not on file  Tobacco Use  . Smoking status: Never Smoker  . Smokeless tobacco: Never Used  Substance and Sexual Activity  . Alcohol use: Yes    Comment: occasionally  . Drug use: No  . Sexual activity: Yes    Birth control/protection: Pill  Lifestyle  . Physical activity:    Days per week: Not on file    Minutes per session: Not on file  . Stress: Not on file  Relationships  . Social connections:    Talks  on phone: Not on file    Gets together: Not on file    Attends religious service: Not on file    Active member of club or organization: Not on file    Attends meetings of clubs or organizations: Not on file    Relationship status: Not on file  . Intimate partner violence:    Fear of current or ex partner: Not on file    Emotionally abused: Not on file    Physically abused: Not on file    Forced sexual activity: Not on file  Other Topics Concern  . Not on file  Social History Narrative  . Not on file    Outpatient Medications Prior to Visit  Medication Sig Dispense Refill  . norgestimate-ethinyl estradiol (SPRINTEC 28) 0.25-35 MG-MCG tablet Take 1 tablet by mouth daily. 84 tablet 3   No facility-administered medications prior to visit.     No Known Allergies  ROS: As per HPI.     Objective:    Physical Exam  Constitutional: She appears well-developed and well-nourished. No distress.  HENT:  Head: Normocephalic and atraumatic.  Eyes: Pupils are equal, round, and reactive to light. Right eye exhibits no discharge. Left eye exhibits no discharge. No scleral icterus.  Neck: No JVD present.  Cardiovascular: Normal rate and regular rhythm.  Pulmonary/Chest: Effort  normal and breath sounds normal. No respiratory distress.  Abdominal: Soft. She exhibits no distension. There is no abdominal tenderness.  Musculoskeletal: Normal range of motion.        General: No deformity or edema.  Neurological: She is alert. She exhibits normal muscle tone.  Skin: Skin is dry.  Psychiatric: She has a normal mood and affect. Her behavior is normal.   EKG: NSR Amb Pulse Ox: 99+%.   BP 120/80   Pulse 91   Temp 98.6 F (37 C) (Oral)   Wt 133 lb 4 oz (60.4 kg)   SpO2 99%   BMI 23.60 kg/m  Wt Readings from Last 3 Encounters:  03/23/19 133 lb 4 oz (60.4 kg)  07/16/18 134 lb (60.8 kg)  06/28/17 134 lb (60.8 kg)    Health Maintenance Due  Topic Date Due  . PAP-Cervical Cytology  Screening  10/27/2018  . PAP SMEAR-Modifier  10/27/2018    There are no preventive care reminders to display for this patient.   No results found for: TSH Lab Results  Component Value Date   WBC 5.0 12/15/2018   HGB 13.0 12/15/2018   HCT 39.8 12/15/2018   MCV 89.0 12/15/2018   PLT 198 12/15/2018   Lab Results  Component Value Date   NA 138 12/15/2018   K 3.7 12/15/2018   CO2 21 (L) 12/15/2018   GLUCOSE 90 12/15/2018   BUN 9 12/15/2018   CREATININE 0.67 12/15/2018   CALCIUM 9.1 12/15/2018   ANIONGAP 8 12/15/2018   No results found for: CHOL No results found for: HDL No results found for: LDLCALC No results found for: TRIG No results found for: CHOLHDL No results found for: DGLO7F     Assessment & Plan:   Problem List Items Addressed This Visit    None    Visit Diagnoses    Palpitations    -  Primary   Relevant Orders   Brain natriuretic peptide   D-dimer, quantitative (not at [facility])   CBC with Differential   Basic Metabolic Panel   Beta hCG quant (ref lab)   Dyspnea, unspecified type       Relevant Orders   Brain natriuretic peptide   D-dimer, quantitative (not at [facility])   CBC with Differential   Basic Metabolic Panel   Epigastric abdominal pain       Relevant Orders   H. pylori breath test     Palpitations with dyspnea on exertion.  Low cardiac risk by age and family history, however does have symptoms worrisome for heart failure including dyspnea on exertion and orthopnea.  An occult PE could also give symptoms like this although patient has no persistent tachycardia or hypoxia when ambulating.  PERC is 1 and cannot rule it out because patient is on OCPs.  Anemia or pregnancy could also cause palpitations and dyspnea.  Patient did report irregular menses.  May have anemia from menses.  Electrolyte abnormalities could also cause things such as multifocal tachycardia or arrhythmia such as PAC/PVC. -D-dimer -BNP -CBC -BMP -bHCG  Epigastric abdominal pain  with easy setitaty, worse when laying down. Possible GERD vs. Dyspepsia. Will get H pylori. If negative, trial on PPIs.   No orders of the defined types were placed in this encounter.    Garnette Gunner, MD

## 2019-03-24 LAB — CBC WITH DIFFERENTIAL/PLATELET
Basophils Absolute: 0 10*3/uL (ref 0.0–0.2)
Basos: 1 %
EOS (ABSOLUTE): 0.1 10*3/uL (ref 0.0–0.4)
Eos: 1 %
Hematocrit: 39.1 % (ref 34.0–46.6)
Hemoglobin: 13.5 g/dL (ref 11.1–15.9)
Immature Grans (Abs): 0 10*3/uL (ref 0.0–0.1)
Immature Granulocytes: 0 %
Lymphocytes Absolute: 1.6 10*3/uL (ref 0.7–3.1)
Lymphs: 33 %
MCH: 29.7 pg (ref 26.6–33.0)
MCHC: 34.5 g/dL (ref 31.5–35.7)
MCV: 86 fL (ref 79–97)
Monocytes Absolute: 0.4 10*3/uL (ref 0.1–0.9)
Monocytes: 8 %
Neutrophils Absolute: 2.8 10*3/uL (ref 1.4–7.0)
Neutrophils: 57 %
Platelets: 218 10*3/uL (ref 150–450)
RBC: 4.54 x10E6/uL (ref 3.77–5.28)
RDW: 12 % (ref 11.7–15.4)
WBC: 5 10*3/uL (ref 3.4–10.8)

## 2019-03-24 LAB — BASIC METABOLIC PANEL
BUN/Creatinine Ratio: 13 (ref 9–23)
BUN: 8 mg/dL (ref 6–20)
CO2: 22 mmol/L (ref 20–29)
Calcium: 9.7 mg/dL (ref 8.7–10.2)
Chloride: 106 mmol/L (ref 96–106)
Creatinine, Ser: 0.64 mg/dL (ref 0.57–1.00)
GFR calc Af Amer: 139 mL/min/{1.73_m2} (ref >59)
GFR calc non Af Amer: 121 mL/min/{1.73_m2} (ref >59)
Glucose: 90 mg/dL (ref 65–99)
Potassium: 4.4 mmol/L (ref 3.5–5.2)
Sodium: 140 mmol/L (ref 134–144)

## 2019-03-24 LAB — BETA HCG QUANT (REF LAB): hCG Quant: <1 m[IU]/mL

## 2019-03-24 LAB — H. PYLORI BREATH TEST: H pylori Breath Test: NEGATIVE

## 2019-03-24 LAB — D-DIMER, QUANTITATIVE (NOT AT ARMC): D-DIMER: <0.2 mg/L FEU (ref 0.00–0.49)

## 2019-03-24 LAB — BRAIN NATRIURETIC PEPTIDE: BNP: <2.5 pg/mL (ref 0.0–100.0)

## 2019-03-24 LAB — SPECIMEN STATUS REPORT

## 2019-03-24 MED ORDER — OMEPRAZOLE 40 MG PO CPDR: 40.0000 mg | DELAYED_RELEASE_CAPSULE | Freq: Every day | ORAL | 3 refills | Status: DC

## 2019-03-24 MED ORDER — PANTOPRAZOLE SODIUM 40 MG PO TBEC: 40.0000 mg | DELAYED_RELEASE_TABLET | Freq: Every day | ORAL | 3 refills | Status: DC

## 2019-03-24 NOTE — Progress Notes (Signed)
Please call patient and tell her that all her labs are normal. She can call back to schedule another appointment discuss further management.

## 2019-03-24 NOTE — Addendum Note (Signed)
Addended by: Fanny Bien B on: 03/24/2019 03:45 PM   Modules accepted: Orders

## 2019-03-24 NOTE — Progress Notes (Signed)
Patient's D-dimer, CBC, BMP were normal. BNP, urea breath test are still pending. Please call patient and inform her of this. I will send a letter with the remaining results, if they are normal.

## 2019-03-26 ENCOUNTER — Telehealth: Payer: Self-pay | Admitting: Family Medicine

## 2019-03-26 NOTE — Telephone Encounter (Signed)
Calling about palpitations Had episode yesterday.  Called ambulance but had stopped when they arrived Had drank a small coke Not taking PPI regularly  Suggested: Take protonix daily I will send this note to Dr Janee Morn who may want to refer her to a cardiologist for prolonged monitoring May consider a TSH since she related she had been losing weight   She can try a vagal valsalva maneuver if this happens again If prolonged or associated with chest pain or shortness of breath she should seek medical attention

## 2019-03-31 ENCOUNTER — Encounter: Payer: Self-pay | Admitting: Family Medicine

## 2019-03-31 ENCOUNTER — Telehealth (INDEPENDENT_AMBULATORY_CARE_PROVIDER_SITE_OTHER): Payer: Self-pay | Admitting: Family Medicine

## 2019-03-31 DIAGNOSIS — N3000 Acute cystitis without hematuria: Secondary | ICD-10-CM

## 2019-03-31 MED ORDER — CEPHALEXIN 500 MG PO CAPS: 500.0000 mg | ORAL_CAPSULE | Freq: Four times a day (QID) | ORAL | 0 refills | Status: AC

## 2019-03-31 MED ORDER — NORGESTIMATE-ETH ESTRADIOL 0.25-35 MG-MCG PO TABS: 1.0000 | ORAL_TABLET | Freq: Every day | ORAL | 3 refills | Status: DC

## 2019-03-31 NOTE — Telephone Encounter (Signed)
[facility] Telemedicine Visit  Patient consented to have virtual visit. Method of visit: Telephone  Encounter participants: Patient: Kathy Salazar - located at home Provider: Westley Chandler - located at [facility]   Chief Complaint: think I have a UTI  HPI:  Kathy Salazar is a pleasant 28 year old with history of palpitations presenting via telephone visit for evaluation of 3 days of burning with urination.  The patient reports urinary frequency and urgency.  This started about 3 days ago.  She also endorses one episode of pink-tinged urine.  She denies back pain, nausea, vomiting, difficulty eating or drinking.  She has not had a fever.  She feels well otherwise. Denies vaginal discharge or vaginal irration.   The patient's last period was April 7.  She use condoms last week when she had intercourse.  She denies signs or symptoms of pregnancy at this time.  She believes that her stomach problems may be related to her contraceptive pills.  She is currently taking a PPI with significant improvement in her epigastric pain.  She does not desire another child at this time.  She has no documented history of venous thromboembolic disease, hypertension or migraine with aura ROS: per HPI  Pertinent PMHx:  Intermittently irregular menstrual cycle.  She reports she does not like that her periods are lighter on Sprintec she believes this is abnormal.  We reviewed the pathophysiology and mechanism of Sprintec.  She is amenable to restarting this medication  Exam:  Respiratory: Breathing comfortably and speaking in full sentences throughout our conversation  Assessment/Plan:  Diagnoses and all orders for this visit:  Acute cystitis without hematuria -     cephALEXin (KEFLEX) 500 MG capsule; Take 1 capsule (500 mg total) by mouth 4 (four) times daily for 5 days.  Contraception  -     norgestimate-ethinyl estradiol (SPRINTEC 28) 0.25-35 MG-MCG tablet; Take 1 tablet by  mouth daily.  Time spent during visit with patient: 12 minutes

## 2019-09-09 ENCOUNTER — Other Ambulatory Visit: Payer: Self-pay

## 2019-09-09 ENCOUNTER — Ambulatory Visit (HOSPITAL_COMMUNITY)
Admission: EM | Admit: 2019-09-09 | Discharge: 2019-09-09 | Disposition: A | Discharge status: Home / Self Care | Payer: Self-pay | Attending: Emergency Medicine | Admitting: Emergency Medicine

## 2019-09-09 ENCOUNTER — Encounter (HOSPITAL_COMMUNITY): Payer: Self-pay

## 2019-09-09 DIAGNOSIS — R319 Hematuria, unspecified: Secondary | ICD-10-CM | POA: Insufficient documentation

## 2019-09-09 DIAGNOSIS — N390 Urinary tract infection, site not specified: Secondary | ICD-10-CM | POA: Insufficient documentation

## 2019-09-09 DIAGNOSIS — Z3202 Encounter for pregnancy test, result negative: Secondary | ICD-10-CM

## 2019-09-09 LAB — POCT URINALYSIS DIP (DEVICE)
Bilirubin Urine: NEGATIVE
Glucose, UA: NEGATIVE mg/dL
Ketones, ur: NEGATIVE mg/dL
Nitrite: NEGATIVE
Protein, ur: 30 mg/dL — AB
Specific Gravity, Urine: 1.01 (ref 1.005–1.030)
Urobilinogen, UA: 0.2 mg/dL (ref 0.0–1.0)
pH: 7 (ref 5.0–8.0)

## 2019-09-09 LAB — POCT PREGNANCY, URINE: Preg Test, Ur: NEGATIVE

## 2019-09-09 MED ORDER — CEPHALEXIN 500 MG PO CAPS: 500.0000 mg | ORAL_CAPSULE | Freq: Two times a day (BID) | ORAL | 0 refills | Status: AC

## 2019-09-09 NOTE — Discharge Instructions (Signed)
Take the antibiotic as prescribed.    Drink 8 to 10 glasses of water each day.    Go to the emergency department if you have increased pain, fever, chills, abdominal pain, back pain, or other concerns.

## 2019-09-09 NOTE — ED Provider Notes (Signed)
[facility]    CSN: [id number] Arrival date & time: 09/09/19  1828      History   Chief Complaint Chief Complaint  Patient presents with  . Urinary Tract Infection    HPI Kathy Salazar is a 28 y.o. female.   Patient presents with dysuria, hematuria, urinary frequency since this morning.  She denies fever, chills, abdominal pain, pelvic pain, vaginal discharge, back pain, or other symptoms.  No significant medical history.  No treatments attempted at home.  The history is provided by the patient.    Past Medical History:  Diagnosis Date  . No pertinent past medical history     Patient Active Problem List   Diagnosis Date Noted  . Contraception management 11/03/2013    Past Surgical History:  Procedure Laterality Date  . APPENDECTOMY      OB History    Gravida  3   Para  2   Term  2   Preterm  0   AB  1   Living  2     SAB  1   TAB  0   Ectopic  0   Multiple  0   Live Births  2            Home Medications    Prior to Admission medications   Medication Sig Start Date End Date Taking? Authorizing Provider  cephALEXin (KEFLEX) 500 MG capsule Take 1 capsule (500 mg total) by mouth 2 (two) times daily for 7 days. 09/09/19 09/16/19  Mickie Bail, NP  norgestimate-ethinyl estradiol (SPRINTEC 28) 0.25-35 MG-MCG tablet Take 1 tablet by mouth daily. 03/31/19   Westley Chandler, MD  omeprazole (PRILOSEC) 40 MG capsule Take 1 capsule (40 mg total) by mouth daily. 03/24/19   Garnette Gunner, MD  pantoprazole (PROTONIX) 40 MG tablet Take 1 tablet (40 mg total) by mouth daily. 03/24/19   Garnette Gunner, MD    Family History Family History  Problem Relation Age of Onset  . Miscarriages / Stillbirths Sister   . Hyperlipidemia Sister     Social History Social History   Tobacco Use  . Smoking status: Never Smoker  . Smokeless tobacco: Never Used  Substance Use Topics  . Alcohol use: Yes    Comment: occasionally  . Drug use: No     Allergies   Patient has no known allergies.   Review of Systems Review of Systems  Constitutional: Negative for chills and fever.  HENT: Negative for ear pain and sore throat.   Eyes: Negative for pain and visual disturbance.  Respiratory: Negative for cough and shortness of breath.   Cardiovascular: Negative for chest pain and palpitations.  Gastrointestinal: Negative for abdominal pain and vomiting.  Genitourinary: Positive for dysuria, frequency and hematuria. Negative for flank pain, pelvic pain and vaginal discharge.  Musculoskeletal: Negative for arthralgias and back pain.  Skin: Negative for color change and rash.  Neurological: Negative for seizures and syncope.  All other systems reviewed and are negative.    Physical Exam Triage Vital Signs ED Triage Vitals  Enc Vitals Group     BP      Pulse      Resp      Temp      Temp src      SpO2      Weight      Height      Head Circumference      Peak Flow      Pain  Score      Pain Loc      Pain Edu?      Excl. in [place]?    No data found.  Updated Vital Signs BP 136/88 (BP Location: Left Arm)   Pulse 90   Temp 98 F (36.7 C) (Tympanic)   Resp 17   SpO2 100%   Visual Acuity Right Eye Distance:   Left Eye Distance:   Bilateral Distance:    Right Eye Near:   Left Eye Near:    Bilateral Near:     Physical Exam Vitals signs and nursing note reviewed.  Constitutional:      General: She is not in acute distress.    Appearance: She is well-developed.  HENT:     Head: Normocephalic and atraumatic.     Mouth/Throat:     Mouth: Mucous membranes are moist.  Eyes:     Conjunctiva/sclera: Conjunctivae normal.  Neck:     Musculoskeletal: Neck supple.  Cardiovascular:     Rate and Rhythm: Normal rate and regular rhythm.     Heart sounds: No murmur.  Pulmonary:     Effort: Pulmonary effort is normal. No respiratory distress.     Breath sounds: Normal breath sounds.  Abdominal:     General: Bowel sounds  are normal.     Palpations: Abdomen is soft.     Tenderness: There is no abdominal tenderness. There is no right CVA tenderness, left CVA tenderness, guarding or rebound.  Skin:    General: Skin is warm and dry.     Findings: No rash.  Neurological:     Mental Status: She is alert.      UC Treatments / Results  Labs (all labs ordered are listed, but only abnormal results are displayed) Labs Reviewed  POCT URINALYSIS DIP (DEVICE) - Abnormal; Notable for the following components:      Result Value   Hgb urine dipstick LARGE (*)    Protein, ur 30 (*)    Leukocytes,Ua SMALL (*)    All other components within normal limits  URINE CULTURE  POC URINE PREG, ED  POCT PREGNANCY, URINE    EKG   Radiology No results found.  Procedures Procedures (including critical care time)  Medications Ordered in UC Medications - No data to display  Initial Impression / Assessment and Plan / UC Course  I have reviewed the triage vital signs and the nursing notes.  Pertinent labs & imaging results that were available during my care of the patient were reviewed by me and considered in my medical decision making (see chart for details).    UTI.  Urine pregnancy negative.  Urine dip positive for small LE, large blood, 30 protein.  Urine culture pending.  Treating with Keflex.  Instructed patient to increase her water intake to 8 to 10 glasses each day.  Instructed her to go to the ED if she has increased pain, fever, chills, abdominal pain, back pain, or other concerns.  Patient agrees to plan of care.     Final Clinical Impressions(s) / UC Diagnoses   Final diagnoses:  Urinary tract infection with hematuria, site unspecified     Discharge Instructions     Take the antibiotic as prescribed.    Drink 8 to 10 glasses of water each day.    Go to the emergency department if you have increased pain, fever, chills, abdominal pain, back pain, or other concerns.        ED Prescriptions  Medication Sig Dispense Auth. Provider   cephALEXin (KEFLEX) 500 MG capsule Take 1 capsule (500 mg total) by mouth 2 (two) times daily for 7 days. 14 capsule Mickie Bailate, Talor Cheema H, NP     PDMP not reviewed this encounter.   Mickie Bailate, Ozetta Flatley H, NP 09/09/19 [phone]

## 2019-09-09 NOTE — ED Triage Notes (Signed)
Pt presents with urinary tract symptoms; pt presents with frequent urination, burning with urination, vaginal irritation, and pelvic pain since last night.

## 2019-09-11 ENCOUNTER — Telehealth (HOSPITAL_COMMUNITY): Payer: Self-pay | Admitting: Emergency Medicine

## 2019-09-11 LAB — URINE CULTURE: Culture: 50000 — AB

## 2019-09-11 NOTE — Telephone Encounter (Signed)
Urine culture was positive for STAPHYLOCOCCUS SAPROPHYTICUS and was given keflex  at urgent care visit. Pt contacted and made aware, educated on completing antibiotic and to follow up if symptoms are persistent. Verbalized understanding.    

## 2019-12-28 ENCOUNTER — Ambulatory Visit: Payer: Self-pay | Admitting: Family Medicine

## 2020-01-12 ENCOUNTER — Encounter: Payer: Self-pay | Admitting: Family Medicine

## 2020-01-12 ENCOUNTER — Ambulatory Visit (INDEPENDENT_AMBULATORY_CARE_PROVIDER_SITE_OTHER): Payer: Self-pay | Admitting: Family Medicine

## 2020-01-12 ENCOUNTER — Other Ambulatory Visit: Payer: Self-pay

## 2020-01-12 VITALS — BP 125/80 | HR 112 | Temp 98.7°F | Ht 63.0 in | Wt 132.0 lb

## 2020-01-12 DIAGNOSIS — Z30011 Encounter for initial prescription of contraceptive pills: Secondary | ICD-10-CM

## 2020-01-12 DIAGNOSIS — E282 Polycystic ovarian syndrome: Secondary | ICD-10-CM | POA: Insufficient documentation

## 2020-01-12 DIAGNOSIS — G5603 Carpal tunnel syndrome, bilateral upper limbs: Secondary | ICD-10-CM

## 2020-01-12 DIAGNOSIS — R002 Palpitations: Secondary | ICD-10-CM

## 2020-01-12 DIAGNOSIS — Z131 Encounter for screening for diabetes mellitus: Secondary | ICD-10-CM

## 2020-01-12 DIAGNOSIS — Z1329 Encounter for screening for other suspected endocrine disorder: Secondary | ICD-10-CM

## 2020-01-12 DIAGNOSIS — L659 Nonscarring hair loss, unspecified: Secondary | ICD-10-CM | POA: Insufficient documentation

## 2020-01-12 LAB — POCT GLYCOSYLATED HEMOGLOBIN (HGB A1C): Hemoglobin A1C: 5.2 % (ref 4.0–5.6)

## 2020-01-12 LAB — POCT URINE PREGNANCY: Preg Test, Ur: NEGATIVE

## 2020-01-12 MED ORDER — NORGESTIMATE-ETH ESTRADIOL 0.25-35 MG-MCG PO TABS: 1.0000 | ORAL_TABLET | Freq: Every day | ORAL | 3 refills | Status: DC

## 2020-01-12 NOTE — Progress Notes (Signed)
CHIEF COMPLAINT / HPI: birth control, hair falling out, palpitations, numbness in hands 1. Patient requests birth control and has previously used sprintec. She has been using condoms in the interim. She is able to easily remember a daily pill, but does endorse side effects such as mild weight gain (~5-10lbs), nausea, and breast tenderness. Patient interested in considering longer term forms of BC including nuva ring and IUD, would like to think about it more. She reports that she has irregular and unpredictable periods when off BC. LMP 12/08/19. She had difficulty getting pregnant, took 3-4 years of trying for first pregnancy and more than a year with subsequent pregnancies (G2 miscarriage with D&C, G3 vaginal birth 3.5 years ago). All pap smears have been normal, due for next pap smear in 10/2020, does not wish to have one today. She has never had gardasil series.   2. Patient has noticed an increase in amount of hair she loses when brushing her hair. She uses a wide paddle brush for her hair and has noticed since August that the amount of hair in her brush is double to triple the size than previously. She has also noted thinning on her temples and crown of her hair. She wears a loose braid to sleep at night and washes her hair twice a week and uses conditioner and wide tooth comb/wet brush to detangle. She has also noticed that her skin is drier. No constipation, weight loss or gain, feeling colder than usual.  3. Patient had work up in April 2020 for palpitations that was normal (ekg, hgb, bmp, bnp, d-dimer all WNL). She does not have SOB at rest or with exertion, not experiencing orthopnea. She has stopped all regular caffeinated beverages and has about 4 oz of decaf coffee once a week with cream. She still occasionally feels funny heart beats that pass quickly. Today she is tachycardic to 112, repeat of 102 after rest, reports she has only had 6oz of H2O so far today. Previously was very concerned  about palpitations, but less concerned now. Back in April, patient had suggestion for holter monitor, and she wonders if this is appropriate to try now. Patient does not have insurance currently.  4. Patient has numbness and tingling in bilateral hands at night. She sleeps on her left side with hands bent at the wrist and tucked under her chin in the fetal position. She notices that when she wakes up, she has to shake them and wait a few minutes until tingling and numbness stop. She does not have these symptoms during the day. 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   PERTINENT  PMH / PSH: n/a   OBJECTIVE: BP 125/80   Pulse (!) 112   Temp 98.7 F (37.1 C) (Oral)   Ht 5\' 3"  (1.6 m)   Wt 132 lb (59.9 kg)   SpO2 98%   BMI 23.38 kg/m   Physical Exam Constitutional:      General: She is not in acute distress.    Appearance: Normal appearance. She is normal weight. She is not ill-appearing.  HENT:     Head: Normocephalic and atraumatic.  Eyes:     Conjunctiva/sclera: Conjunctivae normal.  Cardiovascular:     Rate and Rhythm: Regular rhythm. Tachycardia present.     Pulses: Normal pulses.     Heart sounds: Normal heart sounds. No murmur. No friction rub. No gallop.   Pulmonary:     Effort:  Pulmonary effort is normal. No respiratory distress.     Breath sounds: Normal breath sounds. No stridor. No wheezing, rhonchi or rales.  Abdominal:     General: Abdomen is flat. Bowel sounds are normal.     Palpations: Abdomen is soft.     Tenderness: There is no abdominal tenderness. There is no guarding or rebound.  Musculoskeletal:        General: No swelling. Normal range of motion.     Comments: Tinel sign, phalen's sign, Hawking's sign, Spurlock sign all negative. Normal ROM in bilateral UE. No tenderness in radial styloid or anatomic snuff box.  Skin:    General: Skin is warm and dry.     Capillary Refill: Capillary refill takes  less than 2 seconds.  Neurological:     General: No focal deficit present.     Mental Status: She is alert and oriented to person, place, and time. Mental status is at baseline.  Psychiatric:        Mood and Affect: Mood normal.        Behavior: Behavior normal.        Thought Content: Thought content normal.    ASSESSMENT / PLAN: Ms. Dailah Opperman is a healthy 29 yo woman here for birth control, possible PCOS, palpitations, and carpal tunnel.  Contraception management Patient would like sprintec again, will consider LARC or Nuva Ring. -upreg negative today -refill sprintec x 1 year -due for pap smear 10/2020, reminded her to get done this year -recommended Gardasil series -follow up in 1 year or sooner if needed  PCOS (polycystic ovarian syndrome) Patient with female pattern hair loss along temples and crown with history of oligomenorrhea and difficulty becoming pregnant (more than 3-4 years trying to conceive). She has been off birth control for several months, which is why she has been seeing hair loss since August. She does not have hirsutism on her body, nor is overweight.  - Meets rotterdam criteria by anovulation (oligomenorrhea), and androgenic hair loss pattern on scalp.  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 - TSH to r/o hypothyroidism - treatment with sprintec - follow up as needed symptomatically  Carpal tunnel syndrome Nightime sx of bilateral numbness and tingling. - Recommend sleeping with wrist braces which can be purchased over the counter. - If symptoms persist, can refer to orthopedics for evaluation  Palpitations with regular cardiac rhythm Patient has had appropriate work up this year including reassuring ekg, hgb, bmp, bnp, and d-dimer. Patient is low risk based on family  history and age for adverse cardiac events. The next step in work up would be for a holter monitor, but in the absence of any abnormal lab work, minimal symptoms (no SOB, CP, orthopnea), and patient concern of cost of work up, based on shared decision making the most likely diagnosis is PACs/PVCs which are benign and do not require any treatment. At this time there is no further work up required. However, if patient has more or progressing symptoms such as dyspnea, orthopnea, syncope, etc, then referral to cardiology is appropriate.    Shirlean Mylar, MD [facility], PGY-1

## 2020-01-12 NOTE — Assessment & Plan Note (Signed)
Patient would like sprintec again, will consider LARC or Nuva Ring. -upreg negative today -refill sprintec x 1 year -due for pap smear 10/2020, reminded her to get done this year -recommended Gardasil series -follow up in 1 year or sooner if needed

## 2020-01-12 NOTE — Patient Instructions (Addendum)
It was a pleasure meeting you today!  1. Hair loss: your hair loss is likely related to polycystic ovarian syndrome (PCOS), but I also want to check your thyroid and sugar in your blood. If any of these results are abnormal, I will let you know. PCOS is treated with hormonal birth control, which I prescribed for you. If you are interested in [employer] or the IUD (mirena, [place], [place]), please call and let me know.  2. Hand numbness and tingling: I recommend wearing carpal tunnel wrist braces at night to decrease symptoms. You can find them at any pharmacy.  3. Heart palpitations: the work up so far has been normal and you likely have occasional funny beats in your heart called PACs. These are normal and harmless. If you have chest pain, especially at rest or when exercising or cannot sleep bc trouble breathing, please schedule a follow up appointment.   4. You need a pap smear by the end of this year.  Be Well!  Dr. Chauncey Reading  Polycystic Ovarian Syndrome  Polycystic ovarian syndrome (PCOS) is a common hormonal disorder among women of reproductive age. In most women with PCOS, many small fluid-filled sacs (cysts) grow on the ovaries, and the cysts are not part of a normal menstrual cycle. PCOS can cause problems with your menstrual periods and make it difficult to get pregnant. It can also cause an increased risk of miscarriage with pregnancy. If it is not treated, PCOS can lead to serious health problems, such as diabetes and heart disease. What are the causes? The cause of PCOS is not known, but it may be the result of a combination of certain factors, such as:  Irregular menstrual cycle.  High levels of certain hormones (androgens).  Problems with the hormone that helps to control blood sugar (insulin resistance).  Certain genes. What increases the risk? This condition is more likely to develop in women who have a family history of PCOS. What are the signs or symptoms? Symptoms of  PCOS may include:  Multiple ovarian cysts.  Infrequent periods or no periods.  Periods that are too frequent or too heavy.  Unpredictable periods.  Inability to get pregnant (infertility) because of not ovulating.  Increased growth of hair on the face, chest, stomach, back, thumbs, thighs, or toes.  Acne or oily skin. Acne may develop during adulthood, and it may not respond to treatment.  Pelvic pain.  Weight gain or obesity.  Patches of thickened and dark brown or black skin on the neck, arms, breasts, or thighs (acanthosis nigricans).  Excess hair growth on the face, chest, abdomen, or upper thighs (hirsutism). How is this diagnosed? This condition is diagnosed based on:  Your medical history.  A physical exam, including a pelvic exam. Your health care provider may look for areas of increased hair growth on your skin.  Tests, such as: ? Ultrasound. This may be used to examine the ovaries and the lining of the uterus (endometrium) for cysts. ? Blood tests. These may be used to check levels of sugar (glucose), female hormone (testosterone), and female hormones (estrogen and progesterone) in your blood. How is this treated? There is no cure for PCOS, but treatment can help to manage symptoms and prevent more health problems from developing. Treatment varies depending on:  Your symptoms.  Whether you want to have a baby or whether you need birth control (contraception). Treatment may include nutrition and lifestyle changes along with:  Progesterone hormone to start a menstrual period.  Birth control pills to help you have regular menstrual periods.  Medicines to make you ovulate, if you want to get pregnant.  Medicine to reduce excessive hair growth.  Surgery, in severe cases. This may involve making small holes in one or both of your ovaries. This decreases the amount of testosterone that your body produces. Follow these instructions at home:  Take over-the-counter  and prescription medicines only as told by your health care provider.  Follow a healthy meal plan. This can help you reduce the effects of PCOS. ? Eat a healthy diet that includes lean proteins, complex carbohydrates, fresh fruits and vegetables, low-fat dairy products, and healthy fats. Make sure to eat enough fiber.  If you are overweight, lose weight as told by your health care provider. ? Losing 10% of your body weight may improve symptoms. ? Your health care provider can determine how much weight loss is best for you and can help you lose weight safely.  Keep all follow-up visits as told by your health care provider. This is important. Contact a health care provider if:  Your symptoms do not get better with medicine.  You develop new symptoms. This information is not intended to replace advice given to you by your health care provider. Make sure you discuss any questions you have with your health care provider. Document Revised: 11/08/2017 Document Reviewed: 05/13/2016 Elsevier Patient Education  2020 [employer].

## 2020-01-12 NOTE — Assessment & Plan Note (Signed)
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 - TSH to r/o hypothyroidism - treatment with sprintec - follow up as needed symptomatically

## 2020-01-12 NOTE — Assessment & Plan Note (Signed)
Patient with female pattern hair loss along temples and crown with history of oligomenorrhea and difficulty becoming pregnant (more than 3-4 years trying to conceive). She has been off birth control for several months, which is why she has been seeing hair loss since August. She does not have hirsutism on her body, nor is overweight.  - Meets rotterdam criteria by anovulation (oligomenorrhea), and androgenic hair loss pattern on scalp.  - Treatment with sprintec - Hbg A1c today WNL at 5.2% - Patient does not have insurance, plan to treat and only do hormone testing if necessary

## 2020-01-13 DIAGNOSIS — R002 Palpitations: Secondary | ICD-10-CM | POA: Insufficient documentation

## 2020-01-13 DIAGNOSIS — G5600 Carpal tunnel syndrome, unspecified upper limb: Secondary | ICD-10-CM | POA: Insufficient documentation

## 2020-01-13 LAB — TSH: TSH: 1.47 u[IU]/mL (ref 0.450–4.500)

## 2020-01-13 NOTE — Assessment & Plan Note (Signed)
Nightime sx of bilateral numbness and tingling. - Recommend sleeping with wrist braces which can be purchased over the counter. - If symptoms persist, can refer to orthopedics for evaluation

## 2020-01-14 NOTE — Assessment & Plan Note (Signed)
Patient has had appropriate work up this year including reassuring ekg, hgb, bmp, bnp, and d-dimer. Patient is low risk based on family history and age for adverse cardiac events. The next step in work up would be for a holter monitor, but in the absence of any abnormal lab work, minimal symptoms (no SOB, CP, orthopnea), and patient concern of cost of work up, based on shared decision making the most likely diagnosis is PACs/PVCs which are benign and do not require any treatment. At this time there is no further work up required. However, if patient has more or progressing symptoms such as dyspnea, orthopnea, syncope, etc, then referral to cardiology is appropriate.

## 2020-08-22 ENCOUNTER — Ambulatory Visit (INDEPENDENT_AMBULATORY_CARE_PROVIDER_SITE_OTHER): Payer: Self-pay | Admitting: Family Medicine

## 2020-08-22 ENCOUNTER — Encounter: Payer: Self-pay | Admitting: Family Medicine

## 2020-08-22 ENCOUNTER — Other Ambulatory Visit: Payer: Self-pay

## 2020-08-22 VITALS — BP 120/80 | HR 81 | Ht 63.0 in | Wt 135.0 lb

## 2020-08-22 DIAGNOSIS — Z308 Encounter for other contraceptive management: Secondary | ICD-10-CM

## 2020-08-22 DIAGNOSIS — R300 Dysuria: Secondary | ICD-10-CM

## 2020-08-22 LAB — POCT URINALYSIS DIP (MANUAL ENTRY)
Bilirubin, UA: NEGATIVE
Glucose, UA: NEGATIVE mg/dL
Ketones, POC UA: NEGATIVE mg/dL
Nitrite, UA: NEGATIVE
Protein Ur, POC: 30 mg/dL — AB
Spec Grav, UA: 1.01 (ref 1.010–1.025)
Urobilinogen, UA: 0.2 E.U./dL
pH, UA: 6 (ref 5.0–8.0)

## 2020-08-22 LAB — POCT UA - MICROSCOPIC ONLY
RBC, urine, microscopic: >20
WBC, Ur, HPF, POC: >20

## 2020-08-22 MED ORDER — CEPHALEXIN 500 MG PO CAPS: 500.0000 mg | ORAL_CAPSULE | Freq: Four times a day (QID) | ORAL | 0 refills | Status: AC

## 2020-08-22 NOTE — Patient Instructions (Signed)
   For your UTI, please take Keflex 4 times a day for 5 days.  Please let me know if your symptoms dont improve.  I have sent in a urine culture and will let you know if any changes need to be made.

## 2020-08-22 NOTE — Progress Notes (Signed)
° °  Subjective:   Patient ID: Kathy Salazar    DOB: 15-Jul-1991, 29 y.o. female   MRN: [id number]  Kathy Salazar is a 29 y.o. female here for dysuria.   Dysuria: Patient presenting today with dysuria, frequency, urgency starting this morning.  Denies any hematuria.  Currently in monogamous relationship with her husband.  Has been treating her discomfort with ice packs, cranberry juice, and cold water with minimal improvement.  She attempted to go to urgent care however the wait was 3 to 4 hours.  Denies any fevers, flank pain, nausea, vomiting.  Review of Systems:  Per HPI.   Objective:   BP 120/80    Pulse 81    Ht 5\' 3"  (1.6 m)    Wt 135 lb (61.2 kg)    SpO2 97%    BMI 23.91 kg/m  Vitals and nursing note reviewed.  General: pleasant young female, sitting comfortably on exam bed with her children at her side, well nourished, well developed, in no acute distress with non-toxic appearance Resp: breathing comfortably on room air, speaking in full sentences MSK: gait normal Neuro: Alert and oriented, speech normal  Assessment & Plan:   Dysuria UA with large leuks, trace bacteria and greater than 20 red blood cells and white blood cells.  Given symptoms will empirically treat with Keflex 500 mg 4 times daily x 5 days. Will follow up urine culture and alter treatment if indicated.   Contraception management Patient has voiced desire for pregnancy.  She has discontinued her birth control. Patient inquired about risks of pregnancy at older age. Provided education. Recommended prenatal vitamin. Patient voiced understanding and agreement with plan.   Orders Placed This Encounter  Procedures   Urine Culture   POCT urinalysis dipstick   POCT UA - Microscopic Only   Meds ordered this encounter  Medications   cephALEXin (KEFLEX) 500 MG capsule    Sig: Take 1 capsule (500 mg total) by mouth 4 (four) times daily for 5 days.    Dispense:  20 capsule    Refill:  0   , DO PGY-3, [facility] 08/25/2020 1:30 PM

## 2020-08-25 DIAGNOSIS — R300 Dysuria: Secondary | ICD-10-CM | POA: Insufficient documentation

## 2020-08-25 NOTE — Assessment & Plan Note (Signed)
Patient has voiced desire for pregnancy.  She has discontinued her birth control. Patient inquired about risks of pregnancy at older age. Provided education. Recommended prenatal vitamin. Patient voiced understanding and agreement with plan.

## 2020-08-25 NOTE — Assessment & Plan Note (Signed)
UA with large leuks, trace bacteria and greater than 20 red blood cells and white blood cells.  Given symptoms will empirically treat with Keflex 500 mg 4 times daily x 5 days. Will follow up urine culture and alter treatment if indicated.

## 2020-08-26 LAB — URINE CULTURE

## 2020-10-28 ENCOUNTER — Ambulatory Visit (INDEPENDENT_AMBULATORY_CARE_PROVIDER_SITE_OTHER): Payer: Self-pay | Admitting: Family Medicine

## 2020-10-28 ENCOUNTER — Encounter: Payer: Self-pay | Admitting: Family Medicine

## 2020-10-28 ENCOUNTER — Other Ambulatory Visit: Payer: Self-pay

## 2020-10-28 VITALS — BP 110/64 | HR 85 | Ht 63.0 in | Wt 139.5 lb

## 2020-10-28 DIAGNOSIS — Z1159 Encounter for screening for other viral diseases: Secondary | ICD-10-CM

## 2020-10-28 DIAGNOSIS — Z130 Encounter for screening for diseases of the blood and blood-forming organs and certain disorders involving the immune mechanism: Secondary | ICD-10-CM

## 2020-10-28 NOTE — Patient Instructions (Addendum)
It was a pleasure to see you today!  1. We will get some labs today.  If they are abnormal or we need to do something about them, I will call you.  If they are normal, I will send you a message on MyChart (if it is active) or a letter in the mail.  If you don't hear from [place] in 2 weeks, please call the office  [phone].  2. Good luck with dental surgery!  3. Schedule an appointment a pap smear in the next few months  4. Look into [facility] in [place] if you are interested in aesthetic services  5. Natalie's appointment is 12/3 at 4 PM see you then!  Be Well,  Dr. Leary Roca

## 2020-10-28 NOTE — Progress Notes (Signed)
Subjective:   Chief Complaint  Patient presents with  . Blood work    for surgery    Kathy Salazar  is here for a Pre-operative physical at the request of [facility] with Dr. Metta Clines.   She  is having dental surgery for 4 wisdom teeth removal.  Personal or family hx of adverse outcome to anesthesia? No  Chipped, cracked, missing, or loose teeth? No  Decreased ROM of neck? No  Able to walk up 2 flights of stairs without becoming significantly short of breath or having chest pain? Yes   Revised Goldman Criteria: High Risk Surgery (intraperitoneal, intrathoracic, aortic): No  Ischemic heart disease (Prior MI, +excercise stress test, angina, nitrate use, Qwave): No  History of heart failure: No  History of cerebrovascular disease: No  History of diabetes: No  Insulin therapy for DM: No  Preoperative Cr >2.0: No   Revised Goldman Criteria - risk for major cardiac death No risk factors -- 0.4 percent  Patient Active Problem List   Diagnosis Date Noted  . Dysuria 08/25/2020  . Carpal tunnel syndrome 01/13/2020  . Palpitations with regular cardiac rhythm 01/13/2020  . PCOS (polycystic ovarian syndrome) 01/12/2020  . Non-scarring hair loss 01/12/2020  . Contraception management 11/03/2013   Past Medical History:  Diagnosis Date  . No pertinent past medical history     Past Surgical History:  Procedure Laterality Date  . APPENDECTOMY      Current Outpatient Medications  Medication Sig Dispense Refill  . norgestimate-ethinyl estradiol (SPRINTEC 28) 0.25-35 MG-MCG tablet Take 1 tablet by mouth daily. 84 tablet 3  . omeprazole (PRILOSEC) 40 MG capsule Take 1 capsule (40 mg total) by mouth daily. 30 capsule 3  . pantoprazole (PROTONIX) 40 MG tablet Take 1 tablet (40 mg total) by mouth daily. 30 tablet 3   No current facility-administered medications for this visit.    No Known Allergies   Family History  Problem Relation Age of Onset  . Miscarriages / Stillbirths Sister    . Hyperlipidemia Sister      Review of Systems:  Constitutional:  no unexpected change in weight, no weakness, no unexplained fevers, sweats, or chills Eye:  no recent significant change in vision Ear:  no hearing loss Nose/Mouth/Throat:  Recent fillings Neck/Thyroid:  no lumps or masses Pulmonary:  no chronic cough, sputum, or hemoptysis and no shortness of breath Cardiovascular:  no exercise intolerance, no chest pain Gastrointestinal:  no abdominal pain and no change in bowel habits GU:  negative for dysuria, frequency, and incontinence Musculoskeletal/Extremities:  no peripheral edema Skin/Integumentary ROS:  no abnormal skin lesions reported Neurologic:  no numbness, tingling, or tremor   Objective:   Vitals:   10/28/20 0936  BP: 110/64  Pulse: 85  SpO2: 98%  Weight: 139 lb 8 oz (63.3 kg)  Height: 5\' 3"  (1.6 m)   Body mass index is 24.71 kg/m.  General:  well developed, well nourished, in no apparent distress Skin:  warm, no pallor or diaphoresis Head:  normocephalic, atraumatic Eyes:  pupils equal and round, sclera anicteric without injection Throat/Pharynx:  lips and gingiva without lesion; tongue and uvula midline; non-inflamed pharynx; no exudates or postnasal drainage Neck: neck supple without adenopathy, thyromegaly, or masses, no jugular venous distention Lungs:  clear to auscultation, breath sounds equal bilaterally, no respiratory distress Cardio:  regular rate and rhythm without murmurs, normal S 1, S2 Abdomen:  abdomen soft, nontender; bowel sounds normal; no masses, hepatomegaly or splenomegaly Musculoskeletal:  symmetrical muscle  groups noted without atrophy or deformity Extremities:  no clubbing, cyanosis, or edema, no deformities, no skin discoloration Neuro:  gait normal; alert and oriented to person, place, and time Psych: Age appropriate judgment and insight; normal mood  Assessment:   Healthy 29 yo woman presenting for preoperative clearance  for a low risk oral surgery.  Plan:   Patient reports no cardiac history. Reviewed previous EKGs which were normal, only one with mildly elevated rate to 102. Today she has completely normal exam.  CBC was ordered per Dr. Letta Moynahan request to check platelets, also can screen for anemia.  Patient due for regular quality screen for Hepatitis C in a low risk patient, also ordered. Pending the above workup, the patient is deemed low cardiac risk for the proposed procedure. Reminded patient to schedule an appointment for a pap smear in the next few months.  The patient voiced understanding and agreement to the plan.  Shirlean Mylar, MD PGY-2 10/28/20  10:05 AM

## 2020-10-29 LAB — CBC WITH DIFFERENTIAL/PLATELET
Basophils Absolute: 0 10*3/uL (ref 0.0–0.2)
Basos: 1 %
EOS (ABSOLUTE): 0.1 10*3/uL (ref 0.0–0.4)
Eos: 2 %
Hematocrit: 40.4 % (ref 34.0–46.6)
Hemoglobin: 13.6 g/dL (ref 11.1–15.9)
Immature Grans (Abs): 0 10*3/uL (ref 0.0–0.1)
Immature Granulocytes: 0 %
Lymphocytes Absolute: 1.3 10*3/uL (ref 0.7–3.1)
Lymphs: 34 %
MCH: 29.5 pg (ref 26.6–33.0)
MCHC: 33.7 g/dL (ref 31.5–35.7)
MCV: 88 fL (ref 79–97)
Monocytes Absolute: 0.3 10*3/uL (ref 0.1–0.9)
Monocytes: 8 %
Neutrophils Absolute: 2.2 10*3/uL (ref 1.4–7.0)
Neutrophils: 55 %
Platelets: 165 10*3/uL (ref 150–450)
RBC: 4.61 x10E6/uL (ref 3.77–5.28)
RDW: 11.9 % (ref 11.7–15.4)
WBC: 4 10*3/uL (ref 3.4–10.8)

## 2020-10-29 LAB — HCV AB W REFLEX TO QUANT PCR: HCV Ab: <0.1 s/co ratio (ref 0.0–0.9)

## 2020-10-29 LAB — HCV INTERPRETATION

## 2020-10-30 ENCOUNTER — Encounter: Payer: Self-pay | Admitting: Family Medicine

## 2021-07-14 ENCOUNTER — Other Ambulatory Visit: Payer: Self-pay

## 2021-07-14 ENCOUNTER — Encounter: Payer: Self-pay | Admitting: Family Medicine

## 2021-07-14 ENCOUNTER — Other Ambulatory Visit (HOSPITAL_COMMUNITY)
Admission: RE | Admit: 2021-07-14 | Discharge: 2021-07-14 | Disposition: A | Discharge status: Home / Self Care | Payer: Self-pay | Source: Ambulatory Visit | Attending: Family Medicine | Admitting: Family Medicine

## 2021-07-14 ENCOUNTER — Ambulatory Visit (INDEPENDENT_AMBULATORY_CARE_PROVIDER_SITE_OTHER): Payer: Self-pay | Admitting: Family Medicine

## 2021-07-14 VITALS — BP 119/85 | HR 81 | Ht 64.0 in | Wt 140.4 lb

## 2021-07-14 DIAGNOSIS — Z124 Encounter for screening for malignant neoplasm of cervix: Secondary | ICD-10-CM | POA: Insufficient documentation

## 2021-07-14 DIAGNOSIS — N390 Urinary tract infection, site not specified: Secondary | ICD-10-CM

## 2021-07-14 DIAGNOSIS — Z113 Encounter for screening for infections with a predominantly sexual mode of transmission: Secondary | ICD-10-CM

## 2021-07-14 DIAGNOSIS — N898 Other specified noninflammatory disorders of vagina: Secondary | ICD-10-CM

## 2021-07-14 LAB — POCT URINALYSIS DIP (MANUAL ENTRY)
Bilirubin, UA: NEGATIVE
Glucose, UA: NEGATIVE mg/dL
Ketones, POC UA: NEGATIVE mg/dL
Nitrite, UA: NEGATIVE
Protein Ur, POC: NEGATIVE mg/dL
Spec Grav, UA: 1.015 (ref 1.010–1.025)
Urobilinogen, UA: 0.2 E.U./dL
pH, UA: 5 (ref 5.0–8.0)

## 2021-07-14 LAB — POCT UA - MICROSCOPIC ONLY: Epithelial cells, urine per micros: >20

## 2021-07-14 LAB — POCT WET PREP (WET MOUNT)
Clue Cells Wet Prep Whiff POC: NEGATIVE
Trichomonas Wet Prep HPF POC: ABSENT

## 2021-07-14 NOTE — Progress Notes (Signed)
    SUBJECTIVE:   CHIEF COMPLAINT / HPI:   Patient has been with a monogamous partner but would like to have STD testing.  She reports that she was at the beach last week and started having vaginal discharge that was concerning.  She does report that she is also concerned that she may have a UTI.  She has been having some lower back pain but notes that she is not having any increase in frequency no increased urgency, is not having dysuria.  Pap smear Patient needs a pap smear, last one was completed in 2016  PERTINENT  PMH / PSH: Reviewed  OBJECTIVE:   BP 119/85   Pulse 81   Ht 5\' 4"  (1.626 m)   Wt 140 lb 6.4 oz (63.7 kg)   LMP 05/11/2021   SpO2 100%   BMI 24.10 kg/m   Gen: well-appearing, NAD CV: RRR, no m/r/g appreciated, no peripheral edema Pulm: CTAB, no wheezes/crackles GI: soft, non-tender, non-distended Pelvic exam: normal external genitalia, vulva, vagina, cervix, uterus and adnexa, VAGINA: normal appearing vagina with normal color and discharge, no lesions, vaginal discharge - white and creamy, PAP: Pap smear done today, WET MOUNT done - results: negative for pathogens, normal epithelial cells. Exam chaperoned by 07/11/2021, CMA  ASSESSMENT/PLAN:   Vaginal discharge Patient reports odorous vaginal discharge, minimal discharge in vaginal canal during exam.  Wet prep was negative for any abnormal findings.  STD testing was completed at the same time and will await the results of GC chlamydia testing. - Continue to monitor vaginal discharge for worsening symptoms - Follow GC/CH  Concern for UTI Patient's urinalysis was notable for trace amount of blood and small amount of leukocytes.  Microscopy was completed and showed significant epithelial cells and trace bacteria.  No concerning signs or symptoms of a UTI at this time, patient was counseled on UTI symptoms and to monitor for development of the symptoms.  Pap smear Pap smear completed with HPV testing to done at  the same time.  Will call patient results when they are available.  Cleatrice Burke, DO [facility]

## 2021-07-14 NOTE — Patient Instructions (Signed)
I do not think that you have a UTI at this time.  For your STD testing it also check for several possible other infections, we will call you with the results if there is anything positive and send you in the medications if appropriate.  Your Pap smear was completed today, I will let you know of the results it can take a few days for this to come back.  We will also discuss at the follow-up for repeat at that time.

## 2021-07-19 LAB — CYTOLOGY - PAP
Chlamydia: NEGATIVE
Comment: NEGATIVE
Comment: NEGATIVE
Comment: NORMAL
Diagnosis: UNDETERMINED — AB
High risk HPV: NEGATIVE
Neisseria Gonorrhea: NEGATIVE

## 2021-07-20 ENCOUNTER — Telehealth: Payer: Self-pay | Admitting: Family Medicine

## 2021-07-20 NOTE — Telephone Encounter (Signed)
Called patient with results of Pap smear.  Patient is high risk HPV negative with ASCUS.  We will follow-up with repeat Pap smear in 3 years.  All questions and concerns were addressed on the phone call.  Stephaney Steven, DO

## 2021-12-10 NOTE — L&D Delivery Note (Signed)
[facility] Delivery Note  Kathy Salazar is a 31 y.o. [id number] s/p SVD at [redacted]w[redacted]d. She was admitted for PD IOL.   SROM: 1h 20m with clear fluid GBS Status:  Positive/-- (09/28 1123) received PCN  Maximum Maternal Temperature:  Temp (24hrs), Avg:98.2 F (36.8 C), Min:98.1 F (36.7 C), Max:98.3 F (36.8 C)    Labor Progress: Patient arrived at 2 cm dilation and was induced with cytotec, pitocin.   Delivery Date/Time: 10/09/2022 at 1955 Delivery: Called to room and patient was complete and pushing. Head delivered in ROA position. No nuchal cord present. Shoulder and body delivered in usual fashion. Infant with spontaneous cry, placed on mother's abdomen, dried and stimulated. Cord clamped x 2 after 1-minute delay, and cut by provider. Cord blood drawn. Placenta delivered spontaneously with gentle cord traction. Fundus firm with massage and Pitocin. Labia, perineum, vagina, and cervix inspected with 2nd degree perineal tear visualized.   Placenta: delivered intact @ 4431 Complications: none Lacerations: 2nd degree perineal tear repaired with 3.0 vicryl rapid EBL: 156 Analgesia: epidural    Infant: APGAR (1 MIN): 9   APGAR (5 MINS): 9   APGAR (10 MINS):    Weight: pending  Lowry Ram, MD  PGY-1, [facility]  10/09/2022 8:45 PM

## 2022-02-28 ENCOUNTER — Other Ambulatory Visit: Payer: Self-pay

## 2022-02-28 ENCOUNTER — Ambulatory Visit (INDEPENDENT_AMBULATORY_CARE_PROVIDER_SITE_OTHER): Payer: Self-pay | Admitting: Family Medicine

## 2022-02-28 VITALS — BP 108/64 | HR 68 | Wt 141.5 lb

## 2022-02-28 DIAGNOSIS — R350 Frequency of micturition: Secondary | ICD-10-CM

## 2022-02-28 DIAGNOSIS — Z3200 Encounter for pregnancy test, result unknown: Secondary | ICD-10-CM

## 2022-02-28 LAB — POCT URINALYSIS DIP (MANUAL ENTRY)
Bilirubin, UA: NEGATIVE
Glucose, UA: NEGATIVE mg/dL
Ketones, POC UA: NEGATIVE mg/dL
Nitrite, UA: NEGATIVE
Protein Ur, POC: NEGATIVE mg/dL
Spec Grav, UA: 1.02 (ref 1.010–1.025)
Urobilinogen, UA: 0.2 E.U./dL
pH, UA: 6.5 (ref 5.0–8.0)

## 2022-02-28 LAB — POCT URINE PREGNANCY: Preg Test, Ur: POSITIVE — AB

## 2022-02-28 NOTE — Patient Instructions (Signed)
It was great seeing you today! ? ?Today we discussed your pregnancy test, you tested positive. Congratulations on your pregnancy! We did blood work today, I will let you know of any abnormal results. You will follow up soon for your initial OB visit which will be an hour long. Please take your prenatal vitamin daily.  ? ?Please follow up at your next scheduled appointment, if anything arises between now and then, please don't hesitate to contact our office. ? ? ?Thank you for allowing [place] to be a part of your medical care! ? ?Thank you, ?Dr. Robyne Peers  ?

## 2022-02-28 NOTE — Progress Notes (Signed)
? ? ?  SUBJECTIVE:  ? ?CHIEF COMPLAINT / HPI:  ? ?Patient presents for pregnancy test. She has had two positive pregnancy tests at home. Unsure of LMP, she does not have regular periods. She is certain that she had one 4 weeks ago but she cannot go by this as her cycle is very irregular. She started feeling sick 3 weeks ago so does not believe that she has been pregnancy for this long. Endorsing mild nausea and fatigue. She has not been on birth control.  ? ?OBJECTIVE:  ? ?BP 108/64   Pulse 68   Wt 141 lb 8 oz (64.2 kg)   SpO2 99%   BMI 24.29 kg/m?   ?General: Patient well-appearing, in no acute distress. ?CV: RRR, no murmurs or gallops  ?Resp: CTAB ?Psych: mood appropriate ? ?ASSESSMENT/PLAN:  ? ?Potential pregnancy ?-positive Upreg today, confirmed pregnancy ?-will need dating [place] as patient is unsure of LMP as she has very irregular menstrual cycles ?-continue prenatal vitamin daily ?-continue tobacco and alcohol abstinence ?-initial OB blood work performed today, awaiting results ?-instructed to schedule initial OB visit at earliest convenience  ?-PHQ-9 score of 3 with negative question 9 reviewed.   ? ?Donney Dice, DO ?[place]  ?

## 2022-03-05 ENCOUNTER — Encounter: Payer: Self-pay | Admitting: Family Medicine

## 2022-03-05 LAB — CBC/D/PLT+RPR+RH+ABO+RUBIGG...
Antibody Screen: NEGATIVE
Basophils Absolute: 0 10*3/uL (ref 0.0–0.2)
Basos: 0 %
Bilirubin, UA: NEGATIVE
EOS (ABSOLUTE): 0.1 10*3/uL (ref 0.0–0.4)
Eos: 1 %
Glucose, UA: NEGATIVE
HCV Ab: NONREACTIVE
HIV Screen 4th Generation wRfx: NONREACTIVE
Hematocrit: 36.9 % (ref 34.0–46.6)
Hemoglobin: 12.2 g/dL (ref 11.1–15.9)
Hepatitis B Surface Ag: NEGATIVE
Immature Grans (Abs): 0 10*3/uL (ref 0.0–0.1)
Immature Granulocytes: 0 %
Ketones, UA: NEGATIVE
Lymphocytes Absolute: 1.1 10*3/uL (ref 0.7–3.1)
Lymphs: 18 %
MCH: 29 pg (ref 26.6–33.0)
MCHC: 33.1 g/dL (ref 31.5–35.7)
MCV: 88 fL (ref 79–97)
Monocytes Absolute: 0.4 10*3/uL (ref 0.1–0.9)
Monocytes: 6 %
Neutrophils Absolute: 4.8 10*3/uL (ref 1.4–7.0)
Neutrophils: 75 %
Nitrite, UA: NEGATIVE
Platelets: 167 10*3/uL (ref 150–450)
Protein,UA: NEGATIVE
RBC, UA: NEGATIVE
RBC: 4.2 x10E6/uL (ref 3.77–5.28)
RDW: 12.4 % (ref 11.7–15.4)
RPR Ser Ql: NONREACTIVE
Rh Factor: POSITIVE
Rubella Antibodies, IGG: 1.39 index (ref >0.99)
Specific Gravity, UA: 1.014 (ref 1.005–1.030)
Urobilinogen, Ur: 0.2 mg/dL (ref 0.2–1.0)
WBC: 6.4 10*3/uL (ref 3.4–10.8)
pH, UA: 6.5 (ref 5.0–7.5)

## 2022-03-05 LAB — HGB FRACTIONATION CASCADE
Hgb A2: 2.9 % (ref 1.8–3.2)
Hgb A: 97.1 % (ref 96.4–98.8)
Hgb F: 0 % (ref 0.0–2.0)
Hgb S: 0 %

## 2022-03-05 LAB — MICROSCOPIC EXAMINATION
Bacteria, UA: NONE SEEN
Casts: NONE SEEN /lpf
RBC, Urine: NONE SEEN /hpf (ref 0–2)

## 2022-03-05 LAB — URINE CULTURE, OB REFLEX

## 2022-03-05 LAB — HCV INTERPRETATION

## 2022-03-07 ENCOUNTER — Ambulatory Visit (INDEPENDENT_AMBULATORY_CARE_PROVIDER_SITE_OTHER): Payer: Self-pay | Admitting: Family Medicine

## 2022-03-07 ENCOUNTER — Other Ambulatory Visit: Payer: Self-pay

## 2022-03-07 ENCOUNTER — Encounter: Payer: Self-pay | Admitting: Family Medicine

## 2022-03-07 DIAGNOSIS — Z3490 Encounter for supervision of normal pregnancy, unspecified, unspecified trimester: Secondary | ICD-10-CM | POA: Insufficient documentation

## 2022-03-07 DIAGNOSIS — Z3491 Encounter for supervision of normal pregnancy, unspecified, first trimester: Secondary | ICD-10-CM

## 2022-03-07 NOTE — Progress Notes (Signed)
?Patient Name: Kathy Salazar ?Date of Birth: 1991/01/01 ?[facility] Initial Prenatal Visit ? ?Kathy Salazar is a 31 y.o. year old G45P2012 at Unknown who presents for her initial prenatal visit. ?Pregnancy is not planned ?She reports  none . ?She is taking a prenatal vitamin.  ?She denies pelvic pain or vaginal bleeding.  ? ?Pregnancy Dating: ?The patient is dated by unknown, awaiting dating ultrasound results.  ?LMP: unsure, maybe 4 months but felt feeling sick 4 weeks ago, has very irregular periods  ?Period is certain:  No.  ?Periods were regular:  No.  ?LMP was a typical period:  No.  ?Using hormonal contraception in 3 months prior to conception: No ? ?Lab Review: ?Blood type: A ?Rh Status: Positive  ?Antibody screen: Negative ?HIV: Negative ?RPR: Negative ?Hemoglobin electrophoresis reviewed: No ?Results of OB urine culture are: Negative ?Rubella: Immune ?Hep C Ab: Negative ?Varicella status is Unknown ? ?PMH: Reviewed and as detailed below: ?HTN: No  ?Gestational Hypertension/preeclampsia: No  ?Type 1 or 2 Diabetes: No  ?Depression:  No  ?Seizure disorder:  No ?VTE: No ,  ?History of STI No,  ?Abnormal Pap smear:  Yes, ASC-US. Recommended repeat PAP in 2 more years.  ?Genital herpes simplex:  No  ? ?PSH: ?Gynecologic Surgery:  no ?Surgical history reviewed, notable for: appendectomy as a child  ? ?Obstetric History: ?Obstetric history tab updated and reviewed.  ?Summary of prior pregnancies: 3 ?Cesarean delivery: No  ?Gestational Diabetes:  No ?Hypertension in pregnancy: No ?History of preterm birth: No ?History of LGA/SGA infant:  No ?History of shoulder dystocia: No ?Used vacuum on first child but otherwise no complications ?Indications for referral were reviewed, and the patient has no obstetric indications for referral to High Risk OB Clinic at this time.  ? ?Social History: ?Partner's name: Kathy Salazar   ?Tobacco use: No ?Alcohol use:  No ?Other substance use:   No ? ?Current Medications:  ?Prenatal vitamin   ?Reviewed and appropriate in pregnancy.  ? ?Genetic and Infection Screen: ?Flow Sheet Updated Yes ? ?Prenatal Exam: ?Gen: Well nourished, well developed.  No distress.  Vitals noted. ?HEENT: Normocephalic, atraumatic.  Neck supple without cervical lymphadenopathy, thyromegaly or thyroid nodules.  Fair dentition. ?CV: RRR no murmur, gallops or rubs ?Lungs: CTA B.  Normal respiratory effort without wheezes or rales. ?Abd: soft, NTND. +BS.  Uterus not appreciated above pelvis. ?Ext: No clubbing, cyanosis or edema. ?Psych: Normal grooming and dress.  Not depressed or anxious appearing.  Normal thought content and process without flight of ideas or looseness of associations ? ?Assessment/Plan: ? ?Kathy Salazar is a 31 y.o. [id number] at Unknown who presents to initiate prenatal care. She is doing well.  ?Current pregnancy issues include none. ? ?Routine prenatal care: ?As dating is not reliable, a dating ultrasound has been ordered. Dating tab updated. ?Pre-pregnancy weight updated. Expected weight gain this pregnancy is 15-25 lbs. ?Prenatal labs reviewed, notable for unremarkable. ?Indications for referral to HROB were reviewed and the patient does not meet criteria for referral.  ?Medication list reviewed and updated.  ?Recommended patient see a dentist for regular care.  ?Bleeding and pain precautions reviewed. ?Importance of prenatal vitamins reviewed.  ?Genetic screening offered. Patient opted for: politely declined genetics screening. ?The patient does have an indication for aspirin therapy beginning at 12-16 weeks. Aspirin was not  recommended today, will plan to start aspirin therapy at 12 weeks.  ?The patient will notage 35 or over at time of delivery. Referral to genetic  counseling was not offered today.  ?The patient has the following risk factors for preexisting diabetes: none. An early 1 hour glucose tolerance test was not ordered. ?Pregnancy Medical Home and  PHQ-9 forms completed, problems noted: Yes ? ?2. Pregnancy issues include the following which were addressed today:  ?Deferred pelvic exam to next visit per patient preference and patient's late arrival.  ?Will need varicella titer at next blood draw or at 28 weeks.  ?Will need to start aspirin at 12 weeks, this was not recommended today given unknown gestational age.  ?PHQ-9 score of 0 reviewed.  ?  ?Follow up 2 weeks for next prenatal visit. Please perform pelvic exam and review dating ultrasound. Start aspirin therapy at minimum of 12 weeks.  ? ? ? ? ? ?

## 2022-03-07 NOTE — Patient Instructions (Addendum)
It was great seeing you today! ? ?Congratulations on your pregnancy! I am glad things are going well. We will get a dating ultrasound to determine how far along you are. Make sure to continue to take your prenatal vitamin daily.  ? ?Go to the MAU at [place] at [facility] if: ?You have pain in your lower abdomen or pelvic area ?Your water breaks.  Sometimes it is a big gush of fluid, sometimes it is just a trickle that keeps getting your underwear wet or running down your legs ?You have vaginal bleeding.  ? ?Please follow up at your next scheduled appointment in 2 weeks, if anything arises between now and then, please don't hesitate to contact our office. ? ? ?Thank you for allowing [place] to be a part of your medical care! ? ?Thank you, ?Dr. Larae Grooms  ?

## 2022-03-12 ENCOUNTER — Encounter: Payer: Self-pay | Admitting: Family Medicine

## 2022-03-23 ENCOUNTER — Encounter: Payer: Self-pay | Admitting: Family Medicine

## 2022-03-28 ENCOUNTER — Ambulatory Visit (INDEPENDENT_AMBULATORY_CARE_PROVIDER_SITE_OTHER): Payer: Self-pay | Admitting: Family Medicine

## 2022-03-28 ENCOUNTER — Other Ambulatory Visit (HOSPITAL_COMMUNITY)
Admission: RE | Admit: 2022-03-28 | Discharge: 2022-03-28 | Disposition: A | Discharge status: Home / Self Care | Payer: Self-pay | Source: Ambulatory Visit | Attending: Family Medicine | Admitting: Family Medicine

## 2022-03-28 VITALS — BP 102/68 | HR 88 | Wt 139.4 lb

## 2022-03-28 DIAGNOSIS — Z3481 Encounter for supervision of other normal pregnancy, first trimester: Secondary | ICD-10-CM

## 2022-03-28 NOTE — Progress Notes (Signed)
?  Patient Name: [name] ?Date of Birth: 10-26-1991 ?[facility] Prenatal Visit ? ?Kathy Salazar is a 31 y.o. [id number] at [redacted]w[redacted]d here for routine follow up. She is dated by early ultrasound.  She reports fatigue, nausea, no bleeding, no contractions, no cramping, and no leaking.  She denies vaginal bleeding.  See flow sheet for details. ? ?Vitals:  ? 03/28/22 1129  ?BP: 102/68  ?Pulse: 88  ? ?A/P: Pregnancy at Unknown.  Doing well.   ? ?Routine Prenatal Care:  ?Dating reviewed, dating tab is correct ?Fetal heart tones Appropriate ?Influenza vaccine not administered as not influenza season.   ?COVID vaccination was discussed and declined. Will continue to counsel. ?The patient has the following indication for screening preexisting diabetes: Reviewed indications for early 1 hour glucose testing, not indicated . ?Anatomy ultrasound ordered to be scheduled at 18-20 weeks. ?Patient is not interested in genetic screening. Patient will consider options such as cell free DNA before next visit. ?Pregnancy education including expected weight gain in pregnancy, OTC medication use, continued use of prenatal vitamin, smoking cessation if applicable, and nutrition in pregnancy.   ?Bleeding and pain precautions reviewed. ?The patient has the following indications for aspirin to begin 81 mg at 12-16 weeks: ?One high risk condition: no single high risk condition  ?MORE than one moderate risk condition: low SES   and prior adverse pregnancy outcome - early 2nd trimester SAB ?Aspirin was  recommended today based upon above risk factors (one high risk condition or more than one moderate risk factor). Patient declined aspirin. ? ?2. Pregnancy issues include the following and were addressed as appropriate today: ? ?Pelvic exam performed today, normal. GC obtained. ?Needs varicella titer at next blood draw or at 28 weeks, whichever is first ?Reviewed dating ultrasound from [place] [facility], EDD and dating  tab updated ?[employer] ?Problem list  and pregnancy box updated: Yes.  ? ?Follow up 4 weeks. ? ? ?

## 2022-03-28 NOTE — Patient Instructions (Addendum)
It was a pleasure to see you today! ? ?Go to the MAU at [facility] at [facility] if: ?You have pain in your lower abdomen or pelvic area ?Your water breaks.  Sometimes it is a big gush of fluid, sometimes it is just a trickle that keeps getting your underwear wet or running down your legs ?You have vaginal bleeding. ? ?Congratulations on your pregnancy. You need to take prenatal vitamins each day. Continue to stay away from cigarettes! Also, avoid raw meats and unpasteurized cheeses as well as cat litter. Only 1 serving of tuna or mercury containing fish per week. You can have deli meat after heating it to steaming to get rid of bacteria. No more than 50 mg of caffeine per day. Avoid ibuprofen, ok to take tylenol. ? ?I have put a list of medications in pregnancy below. Consider COVID vaccine and Aspirin to prevent pre-eclampsia. ? ?Follow up in 1 month ? ? ?Be Well, ? ?Dr. Leary Roca ? ?Safe Medications in Pregnancy  ? ?Acne: ?Benzoyl Peroxide ?Salicylic Acid ? ?Backache/Headache: ?Tylenol: 2 regular strength every 4 hours OR ?             2 Extra strength every 6 hours ? ?Colds/Coughs/Allergies: ?Benadryl (alcohol free) 25 mg every 6 hours as needed ?Breath right strips ?Claritin ?Cepacol throat lozenges ?Chloraseptic throat spray ?Cold-Eeze- up to three times per day ?Cough drops, alcohol free ?Flonase (by prescription only) ?Guaifenesin ?Mucinex ?Robitussin DM (plain only, alcohol free) ?Saline nasal spray/drops ?Sudafed (pseudoephedrine) & Actifed ** use only after [redacted] weeks gestation and if you do not have high blood pressure ?Tylenol ?Vicks Vaporub ?Zinc lozenges ?Zyrtec  ? ?Constipation: ?Colace ?Ducolax suppositories ?Fleet enema ?Glycerin suppositories ?Metamucil ?Milk of magnesia ?Miralax ?Senokot ?Smooth move tea ? ?Diarrhea: ?Kaopectate ?Imodium A-D ? ?*NO pepto Bismol ? ?Hemorrhoids: ?Anusol ?Anusol HC ?Preparation H ?Tucks ? ?Indigestion: ?Tums ?Maalox ?Mylanta ?Zantac   ?Pepcid ? ?Insomnia: ?Benadryl (alcohol free) 25mg  every 6 hours as needed ?Tylenol PM ?Unisom, no Gelcaps ? ?Leg Cramps: ?Tums ?MagGel ? ?Nausea/Vomiting:  ?Bonine ?Dramamine ?Emetrol ?Ginger extract ?Sea bands ?Meclizine  ?Nausea medication to take during pregnancy:  ?Unisom (doxylamine succinate 25 mg tablets) Take one tablet daily at bedtime. If symptoms are not adequately controlled, the dose can be increased to a maximum recommended dose of two tablets daily (1/2 tablet in the morning, 1/2 tablet mid-afternoon and one at bedtime). ?Vitamin B6 100mg  tablets. Take one tablet twice a day (up to 200 mg per day). ? ?Skin Rashes: ?Aveeno products ?Benadryl cream or 25mg  every 6 hours as needed ?Calamine Lotion ?1% cortisone cream ? ?Yeast infection: ?Gyne-lotrimin 7 ?Monistat 7 ? ? ?**If taking multiple medications, please check labels to avoid duplicating the same active ingredients ?**take medication as directed on the label ?** Do not exceed 4000 mg of tylenol in 24 hours ?**Do not take medications that contain aspirin or ibuprofen ? ? ? ?

## 2022-03-29 LAB — CERVICOVAGINAL ANCILLARY ONLY
Chlamydia: NEGATIVE
Comment: NEGATIVE
Comment: NEGATIVE
Comment: NORMAL
Neisseria Gonorrhea: NEGATIVE
Trichomonas: NEGATIVE

## 2022-04-01 ENCOUNTER — Encounter: Payer: Self-pay | Admitting: Family Medicine

## 2022-04-01 NOTE — Progress Notes (Signed)
Normal result letter sent. ? ?Gladys Damme, MD ?[place] [facility], PGY-3 ? ?

## 2022-04-23 ENCOUNTER — Ambulatory Visit (INDEPENDENT_AMBULATORY_CARE_PROVIDER_SITE_OTHER): Payer: Self-pay | Admitting: Family Medicine

## 2022-04-23 VITALS — BP 108/74 | HR 96 | Wt 143.2 lb

## 2022-04-23 DIAGNOSIS — Z3482 Encounter for supervision of other normal pregnancy, second trimester: Secondary | ICD-10-CM

## 2022-04-23 DIAGNOSIS — Z3492 Encounter for supervision of normal pregnancy, unspecified, second trimester: Secondary | ICD-10-CM

## 2022-04-23 NOTE — Patient Instructions (Addendum)
Be sure to keep your ultrasound appointment.   ? ?See list of safe medications in pregnancy.  ? ?Common Medications Safe in Pregnancy for Cough and Cold ?  ?                                                  ?                                                                                    ?Cough: ?Cough Drops ?Robitussin (Plain & DM) ?                       ?Cold/Hayfever:                                                                                   ?            Benadryl                                                                      ?            Claritin                                                                          ?            **Claritin-D                                                                   ?            Chlor-Trimeton                                                             ?  Dimetapp                                                                     ?            Drixoral-Non-Drowsy                                                   ?            Mucinex (Guaifenasin)                                                              ?            Sudafed/Sudafed-12 Hour                                                       ?            **Sudafed PE Pseudoephedrine         ?            Tylenol Cold & Sinus                           ?            Vicks Vapor Rub ?            Zyrtec ?  ?**AVOID if Problems With Blood Pressure ?  ?  ?  ?  ?  ?  ?  ?  ?Second Trimester of Pregnancy ? ?The second trimester of pregnancy is from week 13 through week 27. This is months 4 through 6 of pregnancy. The second trimester is often a time when you feel your best. Your body has adjusted to being pregnant, and you begin to feel better physically. ?During the second trimester: ?Morning sickness has lessened or stopped completely. ?You may have more energy. ?You may have an increase in appetite. ?The second trimester is also a time when the unborn baby (fetus) is growing rapidly. At the end  of the sixth month, the fetus may be up to 12 inches long and weigh about 1? pounds. You will likely begin to feel the baby move (quickening) between 16 and 20 weeks of pregnancy. ?Body changes during your second trimester ?Your body continues to go through many changes during your second trimester. The changes vary and generally return to normal after the baby is born. ?Physical changes ?Your weight will continue to increase. You will notice your lower abdomen bulging out. ?You may begin to get stretch marks on your hips, abdomen, and breasts. ?Your breasts will continue to grow and to become tender. ?Dark spots or blotches (chloasma or mask of pregnancy) may  develop on your face. ?A dark line from your belly button to the pubic area (linea nigra) may appear. ?You may have changes in your hair. These can include thickening of your hair, rapid growth, and changes in texture. Some people also have hair loss during or after pregnancy, or hair that feels dry or thin. ?Health changes ?You may develop headaches. ?You may have heartburn. ?You may develop constipation. ?You may develop hemorrhoids or swollen, bulging veins (varicose veins). ?Your gums may bleed and may be sensitive to brushing and flossing. ?You may urinate more often because the fetus is pressing on your bladder. ?You may have back pain. This is caused by: ?Weight gain. ?Pregnancy hormones that are relaxing the joints in your pelvis. ?A shift in weight and the muscles that support your balance. ?Follow these instructions at home: ?Medicines ?Follow your health care provider's instructions regarding medicine use. Specific medicines may be either safe or unsafe to take during pregnancy. Do not take any medicines unless approved by your health care provider. ?Take a prenatal vitamin that contains at least 600 micrograms (mcg) of folic acid. ?Eating and drinking ?Eat a healthy diet that includes fresh fruits and vegetables, whole grains, good sources of  protein such as meat, eggs, or tofu, and low-fat dairy products. ?Avoid raw meat and unpasteurized juice, milk, and cheese. These carry germs that can harm you and your baby. ?You may need to take these actions to prevent or treat constipation: ?Drink enough fluid to keep your urine pale yellow. ?Eat foods that are high in fiber, such as beans, whole grains, and fresh fruits and vegetables. ?Limit foods that are high in fat and processed sugars, such as fried or sweet foods. ?Activity ?Exercise only as directed by your health care provider. Most people can continue their usual exercise routine during pregnancy. Try to exercise for 30 minutes at least 5 days a week. Stop exercising if you develop contractions in your uterus. ?Stop exercising if you develop pain or cramping in the lower abdomen or lower back. ?Avoid exercising if it is very hot or humid or if you are at a high altitude. ?Avoid heavy lifting. ?If you choose to, you may have sex unless your health care provider tells you not to. ?Relieving pain and discomfort ?Wear a supportive bra to prevent discomfort from breast tenderness. ?Take warm sitz baths to soothe any pain or discomfort caused by hemorrhoids. Use hemorrhoid cream if your health care provider approves. ?Rest with your legs raised (elevated) if you have leg cramps or low back pain. ?If you develop varicose veins: ?Wear support hose as told by your health care provider. ?Elevate your feet for 15 minutes, 3-4 times a day. ?Limit salt in your diet. ?Safety ?Wear your seat belt at all times when driving or riding in a car. ?Talk with your health care provider if someone is verbally or physically abusive to you. ?Lifestyle ?Do not use hot tubs, steam rooms, or saunas. ?Do not douche. Do not use tampons or scented sanitary pads. ?Avoid cat litter boxes and soil used by cats. These carry germs that can cause birth defects in the baby and possibly loss of the fetus by miscarriage or stillbirth. ?Do not  use herbal remedies, alcohol, illegal drugs, or medicines that are not approved by your health care provider. Chemicals in these products can harm your baby. ?Do not use any products that contain nicotine or tobacco, such as cigarettes, e-cigarettes, and chewing tobacco. If you need help quitting, ask your  health care provider. ?General instructions ?During a routine prenatal visit, your health care provider will do a physical exam and other tests. He or she will also discuss your overall health. Keep all follow-up visits. This is important. ?Ask your health care provider for a referral to a local prenatal education class. ?Ask for help if you have counseling or nutritional needs during pregnancy. Your health care provider can offer advice or refer you to specialists for help with various needs. ?Where to find more information ?American Pregnancy Association: americanpregnancy.org ?[employer]: https://www.todd-brady.net/acog.org/en/Womens%20Health/Pregnancy ?Office on Women's Health: MightyReward.co.[place]womenshealth.gov/pregnancy ?Contact a health care provider if you have: ?A headache that does not go away when you take medicine. ?Vision changes or you see spots in front of your eyes. ?Mild pelvic cramps, pelvic pressure, or nagging pain in the abdominal area. ?Persistent nausea, vomiting, or diarrhea. ?A bad-smelling vaginal discharge or foul-smelling urine. ?Pain when you urinate. ?Sudden or extreme swelling of your face, hands, ankles, feet, or legs. ?A fever. ?Get help right away if you: ?Have fluid leaking from your vagina. ?Have spotting or bleeding from your vagina. ?Have severe abdominal cramping or pain. ?Have difficulty breathing. ?Have chest pain. ?Have fainting spells. ?Have not felt your baby move for the time period told by your health care provider. ?Have new or increased pain, swelling, or redness in an arm or leg. ?Summary ?The second trimester of pregnancy is from week 13 through week 27 (months 4  through 6). ?Do not use herbal remedies, alcohol, illegal drugs, or medicines that are not approved by your health care provider. Chemicals in these products can harm your baby. ?Exercise only as directed by yo

## 2022-04-23 NOTE — Progress Notes (Signed)
?  Patient Name: Shandel Busic ?Date of Birth: 1991-07-24 ?[facility] Prenatal Visit ? ?Oluchi Pucci is a 31 y.o. [id number] at [redacted]w[redacted]d here for routine follow up. She is dated by early ultrasound.  She reports nausea.  She denies vaginal bleeding.  No leaking fluid.  See flow sheet for details. ? ?Vitals:  ? 04/23/22 0947  ?BP: 108/74  ?Pulse: 96  ? ? ? ?A/P: Pregnancy at [redacted]w[redacted]d.  Doing well.   ? ?Routine Prenatal Care:  ?Dating reviewed, dating tab is correct ?Fetal heart tones Appropriate ?Influenza vaccine not administered as not influenza season.   ?COVID vaccination was discussed and declined.  ?The patient has the following indication for screening preexisting diabetes: Reviewed indications for early 1 hour glucose testing, not indicated . ?Anatomy ultrasound ordered to be scheduled at 18-20 weeks. ?Patient is not interested in genetic screening.  ?Pregnancy education including expected weight gain in pregnancy, OTC medication use, continued use of prenatal vitamin, smoking cessation if applicable, and nutrition in pregnancy.   ?Bleeding and pain precautions reviewed. ?The patient has the following indications for aspirinto begin 81 mg at 12-16 weeks: ?One high risk condition: no single high risk condition  ?MORE than one moderate risk condition: low SES   ?Aspirin was not  recommended today based upon above risk factors (one high risk condition or more than one moderate risk factor)  ? ?2. Pregnancy issues include the following and were addressed as appropriate today: ? ?Cough: Reviewed pregnancy safe over-the-counter medications as well as use of warm tea and honey.  Continue taking Claritin as I suspect this is postnasal drip cough. ?Problem list  and pregnancy box updated: Yes.  ? ?Follow up 4 weeks. ? ?Katha Cabal, DO ? ?  ? ? ?

## 2022-04-25 ENCOUNTER — Encounter: Payer: Self-pay | Admitting: Family Medicine

## 2022-05-10 ENCOUNTER — Ambulatory Visit: Payer: Self-pay | Attending: Family Medicine

## 2022-05-10 ENCOUNTER — Other Ambulatory Visit: Payer: Self-pay | Admitting: *Deleted

## 2022-05-10 DIAGNOSIS — O358XX0 Maternal care for other (suspected) fetal abnormality and damage, not applicable or unspecified: Secondary | ICD-10-CM | POA: Insufficient documentation

## 2022-05-10 DIAGNOSIS — Z363 Encounter for antenatal screening for malformations: Secondary | ICD-10-CM | POA: Insufficient documentation

## 2022-05-10 DIAGNOSIS — Z3482 Encounter for supervision of other normal pregnancy, second trimester: Secondary | ICD-10-CM

## 2022-05-10 DIAGNOSIS — Z362 Encounter for other antenatal screening follow-up: Secondary | ICD-10-CM

## 2022-05-10 DIAGNOSIS — Z3A19 19 weeks gestation of pregnancy: Secondary | ICD-10-CM | POA: Insufficient documentation

## 2022-05-29 ENCOUNTER — Ambulatory Visit (INDEPENDENT_AMBULATORY_CARE_PROVIDER_SITE_OTHER): Payer: Self-pay | Admitting: Family Medicine

## 2022-05-29 ENCOUNTER — Other Ambulatory Visit (HOSPITAL_COMMUNITY)
Admission: RE | Admit: 2022-05-29 | Discharge: 2022-05-29 | Disposition: A | Discharge status: Home / Self Care | Payer: Self-pay | Source: Ambulatory Visit | Attending: Family Medicine | Admitting: Family Medicine

## 2022-05-29 ENCOUNTER — Other Ambulatory Visit: Payer: Self-pay

## 2022-05-29 VITALS — BP 106/72 | HR 83 | Wt 147.0 lb

## 2022-05-29 DIAGNOSIS — B9689 Other specified bacterial agents as the cause of diseases classified elsewhere: Secondary | ICD-10-CM

## 2022-05-29 DIAGNOSIS — Z113 Encounter for screening for infections with a predominantly sexual mode of transmission: Secondary | ICD-10-CM | POA: Insufficient documentation

## 2022-05-29 DIAGNOSIS — Z3492 Encounter for supervision of normal pregnancy, unspecified, second trimester: Secondary | ICD-10-CM

## 2022-05-29 DIAGNOSIS — N898 Other specified noninflammatory disorders of vagina: Secondary | ICD-10-CM | POA: Insufficient documentation

## 2022-05-29 DIAGNOSIS — N760 Acute vaginitis: Secondary | ICD-10-CM

## 2022-05-29 LAB — POCT WET PREP (WET MOUNT)
Clue Cells Wet Prep Whiff POC: POSITIVE
Trichomonas Wet Prep HPF POC: ABSENT
WBC, Wet Prep HPF POC: >20

## 2022-05-29 MED ORDER — METRONIDAZOLE 500 MG PO TABS: 500.0000 mg | ORAL_TABLET | Freq: Two times a day (BID) | ORAL | 0 refills | Status: DC

## 2022-05-29 NOTE — Progress Notes (Signed)
  [facility] Prenatal Visit  Kathy Salazar is a 31 y.o. [phone] at [redacted]w[redacted]d here for routine follow up. She is dated by early ultrasound.  She reports vaginal irritation and yellow/cream colored vaginal discharge, itching, light pink bleeding .  She reports good fetal movement. No bleeding, loss of fluid, contractions. See flow sheet for details. Vitals:   05/29/22 1001  BP: 106/72  Pulse: 83     A/P: Pregnancy at [redacted]w[redacted]d.  Doing well.   Dating reviewed, dating tab is correct Fetal heart tones Appropriate Fundal height within expected range.  Anatomy ultrasound reviewed and notable for difficult views due to fetal positioning. Appeared normal, scheduled for repeat US in 4 weeks.  Influenza vaccine not administered as not influenza season. Marland Kitchen  COVID vaccination was discussed and declined.  Indications for screening for preexisting diabetes include: Reviewed indications for early 1 hour glucose testing, not indicated .  Pregnancy education provided on the following topics: fetal growth and movement, ultrasound assessment, and upcoming laboratory assessment.   The patient has the following indications for aspirinto begin 81 mg at 12-16 weeks: One high risk condition: no single high risk condition  MORE than one moderate risk condition: low SES   Aspirin was  recommended today based upon above risk factors (one high risk condition or more than one moderate risk factor)- patient declined after counseling Scheduled for [facility] during second trimester on 06/28/22. Preterm labor precautions given.   2. Pregnancy issues include the following and were addressed as appropriate today:   Discussed genetic screening again, patient could still do cell free DNA. After counseling, she again declined. She has no family history in her family or husband's family for aneuploidy, <35 yo.   Problem list and pregnancy box updated: Yes.        Discussed breast vs bottle feeding. Patient  was initially interested in breast feeding, but is worried about pumping as she will return to work at about 6 weeks post partum. Discussed benefits of breast feeding. She bottle fed her other two children, prefers to bottle feed after counseling. Patient has been having some cream/yellow colored discharge with vaginal irritation. Will obtain G/C and wet prep today. Wet prep shows + whiff , + clue cells, + KOH, + yeast. Will treat for BV and retest for yeast.  Follow up 4 weeks.

## 2022-05-29 NOTE — Patient Instructions (Addendum)
It was a pleasure to see you today!  We will get some labs today.  If they are abnormal or we need to do something about them, I will call you.  If they are normal, I will send you a message on MyChart (if it is active) or a letter in the mail.  If you don't hear from [place] in 2 weeks, please call the office  [phone]. Go to the MAU at [facility] at [facility] if: You have cramping/contractions that do not go away with drinking water Your water breaks.  Sometimes it is a big gush of fluid, sometimes it is just a trickle that keeps getting your underwear wet or running down your legs You have vaginal bleeding.    You do not feel your baby moving like normal.  If you do not, get something to eat and drink and lay down and focus on feeling your baby move. If your baby is still not moving like normal, you should go to MAU. Your next appt is: Thursday July 20th at 10:00 AM    Be Well,  Dr. Leary Roca

## 2022-05-30 ENCOUNTER — Telehealth: Payer: Self-pay | Admitting: Family Medicine

## 2022-05-30 LAB — CERVICOVAGINAL ANCILLARY ONLY
Chlamydia: NEGATIVE
Comment: NEGATIVE
Comment: NORMAL
Neisseria Gonorrhea: NEGATIVE

## 2022-05-30 NOTE — Telephone Encounter (Signed)
Called and discussed BV diagnosis with patient.  Prescribed Flagyl 500 mg twice daily x7 days.  Shirlean Mylar, MD [facility], PGY-3

## 2022-06-07 ENCOUNTER — Ambulatory Visit: Payer: Self-pay | Admitting: *Deleted

## 2022-06-07 ENCOUNTER — Ambulatory Visit: Payer: Self-pay | Attending: Obstetrics

## 2022-06-07 ENCOUNTER — Encounter: Payer: Self-pay | Admitting: *Deleted

## 2022-06-07 VITALS — BP 117/68 | HR 91

## 2022-06-07 DIAGNOSIS — Z362 Encounter for other antenatal screening follow-up: Secondary | ICD-10-CM | POA: Insufficient documentation

## 2022-06-07 DIAGNOSIS — Z3A23 23 weeks gestation of pregnancy: Secondary | ICD-10-CM

## 2022-06-07 DIAGNOSIS — O358XX0 Maternal care for other (suspected) fetal abnormality and damage, not applicable or unspecified: Secondary | ICD-10-CM

## 2022-06-28 ENCOUNTER — Other Ambulatory Visit: Payer: Self-pay

## 2022-06-28 ENCOUNTER — Ambulatory Visit (INDEPENDENT_AMBULATORY_CARE_PROVIDER_SITE_OTHER): Payer: Self-pay | Admitting: Family Medicine

## 2022-06-28 ENCOUNTER — Encounter: Payer: Self-pay | Admitting: Family Medicine

## 2022-06-28 VITALS — BP 106/76 | HR 77 | Wt 152.6 lb

## 2022-06-28 DIAGNOSIS — Z3492 Encounter for supervision of normal pregnancy, unspecified, second trimester: Secondary | ICD-10-CM

## 2022-06-28 DIAGNOSIS — O99810 Abnormal glucose complicating pregnancy: Secondary | ICD-10-CM | POA: Insufficient documentation

## 2022-06-28 LAB — POCT 1 HR PRENATAL GLUCOSE: Glucose 1 Hr Prenatal, POC: 159 mg/dL

## 2022-06-28 NOTE — Progress Notes (Signed)
  [facility] Prenatal Visit  Kathy Salazar is a 31 y.o. [phone] at [redacted]w[redacted]d here for routine follow up. She is dated by LMP.  She reports no bleeding, no contractions, no cramping, no leaking, and occasional contractions. She reports fetal movement. Denies vaginal bleeding, loss of fluid, or contractions.  See flow sheet for details.  A/P: Pregnancy at [redacted]w[redacted]d.  Doing well.   Dating reviewed, dating tab is correct Fetal heart tones Appropriate Fundal height within expected range.  Influenza vaccine not administered as not influenza season.   COVID vaccination was discussed and declined.  Screening for gestational diabetes done today.  Pregnancy education completed including: fetal growth, breastfeeding, contraception, and expected weight gain in pregnancy.   The patient does not have a history of Cesarean delivery and no referral to [facility] is indicated Scheduled for [facility] during second trimester on 06/28/22. Preterm labor, bleeding, and pain precautions given.    2. Pregnancy issues include the following and were addressed as appropriate today:   Birth control: considering Nexplanon and IUD, cont counseling next visit.   Problem list and pregnancy box updated: Yes.   Follow up 4 weeks.

## 2022-06-28 NOTE — Patient Instructions (Signed)
Please make a follow up appointment in 2 weeks.   Pregnancy Related Return Precautions The follow are signs/symptoms that are abnormal in pregnancy and may require further evaluation by a physician: Go to the MAU at [facility] at [facility] if: You have cramping/contractions that do not go away with drinking water, especially if they are lasting 30 seconds to 1.5 minutes, coming and going every 5-10 minutes for an hour or more, or are getting stronger and you cannot walk or talk while having a contraction/cramp. Your water breaks.  Sometimes it is a big gush of fluid, sometimes it is just a trickle that keeps getting your underwear wet or running down your legs You have vaginal bleeding.    You do not feel your baby moving like normal.  If you do not, get something to eat and drink (something cold or something with sugar like peanut butter or juice) and lay down and focus on feeling your baby move. If your baby is still not moving like normal, you should go to MAU. You should feel your baby move 6 times in one hour, or 10 times in two hours. You have a persistent headache that does not go away with 1 g of Tylenol, vision changes, chest pain, difficulty breathing, severe pain in your right upper abdomen, worsening leg swelling- these can all be signs of high blood pressure in pregnancy and need to be evaluated by a provider immediately  These are all concerning in pregnancy and if you have any of these I recommend you call your PCP and present to the Maternity Admissions Unit (map below) for further evaluation.  For any pregnancy-related emergencies, please go to the Maternity Admissions Unit in the [facility] at [facility]. You will use hospital Entrance C.    Our clinic number is [phone].   Dr Miquel Dunn

## 2022-06-28 NOTE — Addendum Note (Signed)
Addended by: Jennette Bill on: 06/28/2022 11:02 AM   Modules accepted: Orders

## 2022-07-02 ENCOUNTER — Other Ambulatory Visit (INDEPENDENT_AMBULATORY_CARE_PROVIDER_SITE_OTHER): Payer: Self-pay

## 2022-07-02 DIAGNOSIS — Z3492 Encounter for supervision of normal pregnancy, unspecified, second trimester: Secondary | ICD-10-CM

## 2022-07-02 LAB — POCT CBG (FASTING - GLUCOSE)-MANUAL ENTRY: Glucose Fasting, POC: 89 mg/dL (ref 70–99)

## 2022-07-03 LAB — GESTATIONAL GLUCOSE TOLERANCE
Glucose, Fasting: 80 mg/dL (ref 70–94)
Glucose, GTT - 1 Hour: 127 mg/dL (ref 70–179)
Glucose, GTT - 2 Hour: 101 mg/dL (ref 70–154)
Glucose, GTT - 3 Hour: 79 mg/dL (ref 70–139)

## 2022-07-13 ENCOUNTER — Ambulatory Visit (INDEPENDENT_AMBULATORY_CARE_PROVIDER_SITE_OTHER): Payer: Self-pay | Admitting: Family Medicine

## 2022-07-13 ENCOUNTER — Other Ambulatory Visit: Payer: Self-pay

## 2022-07-13 VITALS — BP 107/63 | HR 84 | Wt 153.0 lb

## 2022-07-13 DIAGNOSIS — Z3493 Encounter for supervision of normal pregnancy, unspecified, third trimester: Secondary | ICD-10-CM

## 2022-07-13 NOTE — Patient Instructions (Signed)
It was great seeing you today!  Today we discussed your pregnancy, I am glad that you are doing well! Please continue to take your prenatal vitamin daily.   Please follow up at your next scheduled appointment, if anything arises between now and then, please don't hesitate to contact our office.  Go to the MAU at [facility] at [facility] if: You begin to have strong, frequent contractions Your water breaks.  Sometimes it is a big gush of fluid, sometimes it is just a trickle that keeps getting your underwear wet or running down your legs You have vaginal bleeding.  It is normal to have a small amount of spotting if your cervix was checked.  You do not feel your baby moving like normal.  If you do not, get something to eat and drink and lay down and focus on feeling your baby move.   If your baby is still not moving like normal, you should go to MAU.    Thank you for allowing [place] to be a part of your medical care!  Thank you, Dr. Robyne Peers

## 2022-07-13 NOTE — Progress Notes (Signed)
  [facility] Prenatal Visit  Kathy Salazar is a 31 y.o. [phone] at [redacted]w[redacted]d here for routine follow up. She is dated by LMP.  She reports no complaints. She reports fetal movement. She denies vaginal bleeding, contractions, or loss of fluid. See flow sheet for details.  Vitals:   07/13/22 1022  BP: 107/63  Pulse: 84      A/P: Pregnancy at [redacted]w[redacted]d.  Doing well.   Routine prenatal care:  Dating reviewed, dating tab is correct. Fetal heart tones appropriate. Fundal height appropriate. Infant feeding choice: Formula feeding  Contraception choice: Nexplanon  Infant circumcision desired no  The patient does not have history of C-section.  Influenza vaccine not given as not in season.  Tdap was not given today as [facility]. Discussed extensively the benefit of this to baby and how it is a strong recommendation for each pregnancy even though she has had it in the past. Letter provided to have administered at the [facility]  CBC, RPR, and HIV were obtained today. Discussed the importance of these labs. 1 hour glucola previously obtained and failed but passed 3 hr glucola testing. Discussed the results of glucose testing.   Rh status was reviewed and patient does not need Rhogam.  Rhogam was not given today.  Pregnancy medical home and PHQ-9 forms were done today and reviewed.   Childbirth and education classes were offered. Pregnancy education regarding benefits of breastfeeding, contraception, fetal growth, expected weight gain, and safe infant sleep were discussed.  Preterm labor and fetal movement precautions reviewed.  2. Pregnancy issues include the following and were addressed as appropriate today:   PHQ-9 score of 0 reviewed.   Problem list and pregnancy box updated: Yes.    Follow up 2 weeks. Patient will need to be scheduled for [facility] during third trimester.

## 2022-07-16 ENCOUNTER — Encounter: Payer: Self-pay | Admitting: Family Medicine

## 2022-07-16 LAB — HIV ANTIBODY (ROUTINE TESTING W REFLEX): HIV Screen 4th Generation wRfx: NONREACTIVE

## 2022-07-16 LAB — RPR W/REFLEX TO TREPSURE: RPR: NONREACTIVE

## 2022-07-16 LAB — T PALLIDUM ANTIBODY, EIA: T pallidum Antibody, EIA: NEGATIVE

## 2022-07-27 ENCOUNTER — Ambulatory Visit (INDEPENDENT_AMBULATORY_CARE_PROVIDER_SITE_OTHER): Payer: Self-pay | Admitting: Family Medicine

## 2022-07-27 VITALS — BP 106/85 | HR 85 | Wt 154.0 lb

## 2022-07-27 DIAGNOSIS — Z3493 Encounter for supervision of normal pregnancy, unspecified, third trimester: Secondary | ICD-10-CM

## 2022-07-27 NOTE — Progress Notes (Signed)
  [facility] Prenatal Visit  Kathy Salazar is a 31 y.o. [phone] at [redacted]w[redacted]d here for routine follow up. She is dated by LMP.  She reports  feeling tired but otherwise well . She reports fetal movement. She denies vaginal bleeding, contractions, or loss of fluid. See flow sheet for details.  Vitals:   07/27/22 1028  BP: 106/85  Pulse: 85      A/P: Pregnancy at [redacted]w[redacted]d.  Doing well.   Routine prenatal care:  Dating reviewed, dating tab is correct. Fetal heart tones appropriate.  Fundal height appropriate. Infant feeding choice: Formula feeding  Contraception choice: Nexplanon  Infant circumcision desired no.  The patient does not have a history of C-section.  Influenza vaccine not given as it was not in season. Tdap was not given today. Patient wanted to further discuss from last visit, discussed the importance of this extensively. Letter already provided to have this administered at [facility].   1 hour glucola, CBC, RPR, and HIV were not obtained today. All obtained last visit, CBC collected but not resulted. Will plan to obtain at next visit if still not returned yet.   Rh status was reviewed and patient does not need Rhogam.  Rhogam was not given today.  Pregnancy medical home and PHQ-9 forms were done today and reviewed.   Childbirth and education classes were offered. Pregnancy education regarding benefits of breastfeeding, contraception, fetal growth, expected weight gain, and safe infant sleep were discussed.  Preterm labor and fetal movement precautions reviewed.  2. Pregnancy issues include the following and were addressed as appropriate today:   CBC collected at 28 week visit but not resulted, will plan to recheck at next visit if this has not resulted. Discussed the importance of this to determine anemia during pregnancy.  Problem list and pregnancy box updated: Yes.   Pregnancy precautions discussed. Follow up 2 weeks. Schedule in faculty OB  visit for 36 week visit.

## 2022-07-27 NOTE — Patient Instructions (Signed)
It was great seeing you today!  I am glad that you are doing well! Please continue to take your prenatal vitamin daily.   Go to the MAU at [facility] at [facility] if: You begin to have strong, frequent contractions Your water breaks.  Sometimes it is a big gush of fluid, sometimes it is just a trickle that keeps getting your underwear wet or running down your legs You have vaginal bleeding.  It is normal to have a small amount of spotting if your cervix was checked.  You do not feel your baby moving like normal.  If you do not, get something to eat and drink and lay down and focus on feeling your baby move.   If your baby is still not moving like normal, you should go to MAU.   Please follow up at your next scheduled appointment in 2 weeks, if anything arises between now and then, please don't hesitate to contact our office.   Thank you for allowing [place] to be a part of your medical care!  Thank you, Dr. Robyne Peers

## 2022-08-08 ENCOUNTER — Ambulatory Visit (INDEPENDENT_AMBULATORY_CARE_PROVIDER_SITE_OTHER): Payer: Self-pay | Admitting: Family Medicine

## 2022-08-08 VITALS — BP 110/65 | HR 65 | Wt 156.5 lb

## 2022-08-08 DIAGNOSIS — Z3493 Encounter for supervision of normal pregnancy, unspecified, third trimester: Secondary | ICD-10-CM

## 2022-08-08 NOTE — Patient Instructions (Signed)
It was great seeing you today!  I am glad that your pregnancy is going well! Please continue to take your prenatal vitamin. Today we checked your blood count, I will let you know of any abnormal results.  Go to the MAU at [facility] if: You begin to have strong, frequent contractions Your water breaks.  Sometimes it is a big gush of fluid, sometimes it is just a trickle that keeps getting your underwear wet or running down your legs You have vaginal bleeding.  It is normal to have a small amount of spotting if your cervix was checked.  You do not feel your baby moving like normal.  If you do not, get something to eat and drink and lay down and focus on feeling your baby move.   If your baby is still not moving like normal, you should go to MAU.   Please follow up at your next scheduled appointment in 2 weeks, if anything arises between now and then, please don't hesitate to contact our office.   Thank you for allowing [place] to be a part of your medical care!  Thank you, Dr. Robyne Peers

## 2022-08-08 NOTE — Progress Notes (Signed)
  [facility] Prenatal Visit  Kathy Salazar is a 31 y.o. [phone] at [redacted]w[redacted]d here for routine follow up. She is dated by LMP.  She reports no complaints.  She reports fetal movement. She denies vaginal bleeding, contractions, or loss of fluid.  See flow sheet for details.  Vitals:   08/08/22 0917  BP: 110/65  Pulse: 65     A/P: Pregnancy at [redacted]w[redacted]d.  Doing well.   Routine prenatal care:  Dating reviewed, dating tab is correct. Fetal heart tones: appropriate. Fundal height: appropriate. The patient does not have a history of HSV and valacyclovir is not indicated at this time.  The patient does not have a history of C-section.  Infant feeding choice: Formula feeding  Contraception choice: Nexplanon  Infant circumcision desired no Influenza vaccine not given as it is not in season. Tdap was not given today. Letter provided to have it administered at the [facility] but they said she had to wait a few weeks. She is open to getting it at our clinic but would prefer to do it at the next visit when she can pay the day of.  COVID vaccination was discussed and patient politely declines.  Childbirth and education classes were offered. Pregnancy education regarding benefits of breastfeeding, contraception, fetal growth, expected weight gain, and safe infant sleep were discussed.  Preterm labor and fetal movement precautions reviewed.   2. Pregnancy issues include the following and were addressed as appropriate today:  CBC ordered again as there was a lab error from prior visit, want to evaluate for anemia during pregnancy.   Problem list and pregnancy box updated: Yes.   -PHQ-9 score of 0 reviewed.   Scheduled for Ob [facility] clinic in third trimester on 9/28.   Follow up 2 weeks on 9/13. Patient needs Tdap at this visit, previously discussed with patient and she agrees.

## 2022-08-09 ENCOUNTER — Other Ambulatory Visit: Payer: Self-pay | Admitting: Family Medicine

## 2022-08-09 DIAGNOSIS — O99013 Anemia complicating pregnancy, third trimester: Secondary | ICD-10-CM

## 2022-08-09 DIAGNOSIS — O99019 Anemia complicating pregnancy, unspecified trimester: Secondary | ICD-10-CM | POA: Insufficient documentation

## 2022-08-09 LAB — CBC
Hematocrit: 33.1 % — ABNORMAL LOW (ref 34.0–46.6)
Hemoglobin: 10.8 g/dL — ABNORMAL LOW (ref 11.1–15.9)
MCH: 27.3 pg (ref 26.6–33.0)
MCHC: 32.6 g/dL (ref 31.5–35.7)
MCV: 84 fL (ref 79–97)
Platelets: 126 10*3/uL — ABNORMAL LOW (ref 150–450)
RBC: 3.96 x10E6/uL (ref 3.77–5.28)
RDW: 11.7 % (ref 11.7–15.4)
WBC: 6.2 10*3/uL (ref 3.4–10.8)

## 2022-08-09 MED ORDER — FERROUS SULFATE 325 (65 FE) MG PO TABS: 325.0000 mg | ORAL_TABLET | ORAL | 1 refills | Status: DC

## 2022-08-21 NOTE — Progress Notes (Signed)
  [facility] Prenatal Visit  Kathy Salazar is a 31 y.o. [phone] at [redacted]w[redacted]d here for routine follow up. She is dated by [redacted]w[redacted]d ultrasound.  She reports no complaints.  She reports fetal movement. She denies vaginal bleeding, contractions, or loss of fluid.  See flow sheet for details.  Vitals:   08/22/22 0935  BP: 109/74  Pulse: 90    A/P: Pregnancy at [redacted]w[redacted]d.  Doing well.   Routine prenatal care:  Dating reviewed, dating tab is correct Fetal heart tones: Appropriate Fundal height: within expected range.  The patient does not have a history of HSV and valacyclovir is not indicated at this time.  The patient does not have a history of Cesarean delivery and no referral to Center for [facility] is indicated Infant feeding choice: Both  Contraception choice: Nexplanon  Infant circumcision desired no Influenza vaccine not administered as patient declined, will continue to discuss.   Letter for Tdap was provided to patient on 8/4 for her to receive at the health department. She reports she was told to wait in line at the health department. She would like to get it in the office today. She is prepared to pay out of pocket.  COVID vaccination was discussed and declines.  Childbirth and education classes were offered and patient declines. Pregnancy education regarding benefits of breastfeeding, contraception, fetal growth, expected weight gain, and safe infant sleep were discussed.  Preterm labor and fetal movement precautions reviewed.  2. Pregnancy issues include the following and were addressed as appropriate today: Anemia: CBC on 8/30 with Hgb 10.8. Having trouble with iron supplements. Advised her to space them out to every other day or every 2-3 days if needed.  Thrombocytopenia of pregnancy- 126k on CBC 8/30 Vertex presentation confirmed by bedside butterfly ultrasound GBS swab at next appointment with Faculty Desires TDAP vaccine here in the office, understands out  of pocket cost. Vaccine provided today.  Problem list and pregnancy box updated: Yes.   Scheduled for Ob Faculty clinic in third trimester on 9/28.   Follow up 2 weeks.

## 2022-08-22 ENCOUNTER — Ambulatory Visit (INDEPENDENT_AMBULATORY_CARE_PROVIDER_SITE_OTHER): Payer: Self-pay | Admitting: Family Medicine

## 2022-08-22 DIAGNOSIS — Z23 Encounter for immunization: Secondary | ICD-10-CM

## 2022-08-22 DIAGNOSIS — Z3493 Encounter for supervision of normal pregnancy, unspecified, third trimester: Secondary | ICD-10-CM

## 2022-08-22 NOTE — Patient Instructions (Signed)
It was wonderful to see you today.  Today we talked about:  If you experience any vaginal bleeding, leakage of fluids, don't feel your baby moving as much, or start to have contractions less than 5 minutes apart that are lasting for 1 hour and painful, please go directly to the Maternal Assessment Unit at [facility] for evaluation.  The two main ways to track fetal kicks are: Count the number of kicks you feel in a one-hour period. Measure the amount of time it takes for the fetus to kick 10 times.  Ten movements (kicks, flutters or rolls) in one hour is considered typical fetal movement. Don't panic if you don't feel 10 movements in an hour. Feeling fewer than 10 kicks doesn't mean something is wrong. It may also take a little longer than one hour to feel 10 movements. This is usually OK and not a cause for worry.  Here are the steps to count fetal kicks:  Choose a time when you are least distracted or when you typically feel the fetus move. Get comfortable. Lie on your left side or sit with your feet propped up. Place your hands on your belly. Start a timer or watch the clock. Count each kick. Keep counting until you get to 10 kicks. Once you reach 10 kicks, jot down how many minutes it took.  Ways to wake the fetus up to count kicks? Taking a walk or moving your body. Drinking juice or another sweet beverage. Eating a meal. Laying down on your left side (this maximizes blood flow). Playing loud music.  Maternity and women's care services located on the Center side of [facility]. [facility] (Entrance C off [address]).  [address] [place],  [place]  [address]      Thank you for choosing [facility].   Please call [phone] with any questions about today's appointment.  Please be sure to schedule follow up at the front  desk before you leave today.   Sabino Dick, DO PGY-3 Family Medicine

## 2022-08-28 ENCOUNTER — Other Ambulatory Visit: Payer: Self-pay

## 2022-08-28 ENCOUNTER — Ambulatory Visit (INDEPENDENT_AMBULATORY_CARE_PROVIDER_SITE_OTHER): Payer: Self-pay | Admitting: Student

## 2022-08-28 VITALS — BP 117/67 | HR 104 | Wt 157.8 lb

## 2022-08-28 DIAGNOSIS — B029 Zoster without complications: Secondary | ICD-10-CM

## 2022-08-28 DIAGNOSIS — G44209 Tension-type headache, unspecified, not intractable: Secondary | ICD-10-CM

## 2022-08-28 DIAGNOSIS — R519 Headache, unspecified: Secondary | ICD-10-CM | POA: Insufficient documentation

## 2022-08-28 MED ORDER — VALACYCLOVIR HCL 1 G PO TABS: 1000.0000 mg | ORAL_TABLET | Freq: Three times a day (TID) | ORAL | 0 refills | Status: AC

## 2022-08-28 NOTE — Progress Notes (Signed)
  SUBJECTIVE:   CHIEF COMPLAINT / HPI:   Headache for 2 days at 34 wks, tylenol not working.  Reporting soreness from tdap vaccine  Headache On Saturday started getting bad headache, from front of head to back of shoulders. Has tried hydrating with H20 and Gatorade, and has not improved. Reports she feels lightheaded when she stand, hasn't fallen or lost consciousness. Also appreciates rash on right side. Has tried taking tylenol ES for headache and it did improve the pain. Pain went from 9-10 to 6. Denies any photo or auditory sensitivities. Pain located in back of head and down shoulders, also appreciates pain in front of head. Never had headache like this before. Has been improving since when it started on Saturday, was worse on Sunday/Monday.   Rash  Has been on side of stomach since Friday, burning and hurting, not spreading. Has been using alcohol and hydrogen peroxide.    PERTINENT  PMH / PSH: Pregnant     OBJECTIVE:  BP 117/67   Pulse (!) 104   Wt 157 lb 12.8 oz (71.6 kg)   LMP 05/11/2021   BMI 27.09 kg/m   General: NAD, pleasant, able to participate in exam Skin: erythematous papules in dermatomal distribution on left flank Neuro: alert, no obvious focal deficits, speech normal, EOMI, PERRL, strength 5/5, sensation grossly intact and symmetric Psych: Normal affect and mood  ASSESSMENT/PLAN:  Shingles Patient with rash on left flank since Friday that burns and hurts. Is not getting better/spreading. Patient had dried to treat with alcohol and hydrogen peroxide. Rash does not itch and follows dermatomal distribution. Less likely eczema or dry skin given distrubution and symptoms. Rash most likely shingles. Patient also notes she's had chickenpox in the past. Patient reports normal fetal movement, w/ no LOF, contractions, or vaginal bleeding.    -Valtrex 1000 mg, TID, for 7 days   Headache Patient reports headache since Saturday, located in front of head, back of head  and extending down should. Pain rated 8/10 and responds well to tylenol, denies any photo/auditory sensitivities. Denies any watery eyes or temporal pain, less likely for Temporal arteritis. No photo/auditory sensitivity, and HA in multiple areas/running down shoulder, most concerning for tension type headache. Patient reports it responds well to tylenol, she was just afraid to use it while pregnant. Re-assurance provided.  -Tylenol 650 mg q6h prn   No orders of the defined types were placed in this encounter.  Meds ordered this encounter  Medications   valACYclovir (VALTREX) 1000 MG tablet    Sig: Take 1 tablet (1,000 mg total) by mouth 3 (three) times daily for 7 days.    Dispense:  21 tablet    Refill:  0   No follow-ups on file. @SIGNNOTE @

## 2022-08-28 NOTE — Assessment & Plan Note (Addendum)
Patient with rash on left flank since Friday that burns and hurts. Is not getting better/spreading. Patient had dried to treat with alcohol and hydrogen peroxide. Rash does not itch and follows dermatomal distribution. Less likely eczema or dry skin given distrubution and symptoms. Rash most likely shingles. Patient also notes she's had chickenpox in the past. Patient reports normal fetal movement, w/ no LOF, contractions, or vaginal bleeding.    -Valtrex 1000 mg, TID, for 7 days

## 2022-08-28 NOTE — Assessment & Plan Note (Addendum)
Patient reports headache since Saturday, located in front of head, back of head and extending down should. Pain rated 8/10 and responds well to tylenol, denies any photo/auditory sensitivities. Denies any watery eyes or temporal pain, less likely for Temporal arteritis. No photo/auditory sensitivity, and HA in multiple areas/running down shoulder, most concerning for tension type headache. Patient reports it responds well to tylenol, she was just afraid to use it while pregnant. Re-assurance provided.  -Tylenol 650 mg q6h prn

## 2022-08-28 NOTE — Patient Instructions (Signed)
It was great to see you! Thank you for allowing me to participate in your care!  Our plans for today:  - Shingles  Valtrex 1000 mg, three times a day, for 1 week  - Headache  Tylenol 650 mg every 6 hours as needed, up to 4 times a day  Seek medical care if Pain worsening/not improving, dizziness/light headedness, pain in temple with watery eyes  Take care and seek immediate care sooner if you develop any concerns.   Dr. Holley Bouche, MD [place]

## 2022-09-06 ENCOUNTER — Other Ambulatory Visit (HOSPITAL_COMMUNITY)
Admission: RE | Admit: 2022-09-06 | Discharge: 2022-09-06 | Disposition: A | Discharge status: Home / Self Care | Payer: Self-pay | Source: Ambulatory Visit | Attending: Family Medicine | Admitting: Family Medicine

## 2022-09-06 ENCOUNTER — Ambulatory Visit (INDEPENDENT_AMBULATORY_CARE_PROVIDER_SITE_OTHER): Payer: Self-pay | Admitting: Family Medicine

## 2022-09-06 ENCOUNTER — Other Ambulatory Visit: Payer: Self-pay

## 2022-09-06 DIAGNOSIS — Z3493 Encounter for supervision of normal pregnancy, unspecified, third trimester: Secondary | ICD-10-CM | POA: Insufficient documentation

## 2022-09-06 LAB — POCT WET PREP (WET MOUNT)
Clue Cells Wet Prep Whiff POC: NEGATIVE
Trichomonas Wet Prep HPF POC: ABSENT

## 2022-09-06 NOTE — Patient Instructions (Addendum)
Go to the MAU at [place] at [facility] if: You begin to have strong, frequent contractions Your water breaks.  Sometimes it is a big gush of fluid, sometimes it is just a trickle that keeps getting your underwear wet or running down your legs You have vaginal bleeding.  It is normal to have a small amount of spotting if your cervix was checked.  You do not feel your baby moving like normal.  If you do not, get something to eat and drink and lay down and focus on feeling your baby move.   If your baby is still not moving like normal, you should go to MAU.   Think about the flu shot - I highly recommend you get this as soon as possible  Third Trimester of Pregnancy  The third trimester of pregnancy is from week 28 through week 60. This is months 7 through 9. The third trimester is a time when the unborn baby (fetus) is growing rapidly. At the end of the ninth month, the fetus is about 20 inches long and weighs 6-10 pounds. Body changes during your third trimester During the third trimester, your body will continue to go through many changes. The changes vary and generally return to normal after your baby is born. Physical changes Your weight will continue to increase. You can expect to gain 25-35 pounds (11-16 kg) by the end of the pregnancy if you begin pregnancy at a normal weight. If you are underweight, you can expect to gain 28-40 lb (about 13-18 kg), and if you are overweight, you can expect to gain 15-25 lb (about 7-11 kg). You may begin to get stretch marks on your hips, abdomen, and breasts. Your breasts will continue to grow and may hurt. A yellow fluid (colostrum) may leak from your breasts. This is the first milk you are producing for your baby. You may have changes in your hair. These can include thickening of your hair, rapid growth, and changes in texture. Some people also have hair loss during or after pregnancy, or hair that feels dry or thin. Your belly button  may stick out. You may notice more swelling in your hands, face, or ankles. Health changes You may have heartburn. You may have constipation. You may develop hemorrhoids. You may develop swollen, bulging veins (varicose veins) in your legs. You may have increased body aches in the pelvis, back, or thighs. This is due to weight gain and increased hormones that are relaxing your joints. You may have increased tingling or numbness in your hands, arms, and legs. The skin on your abdomen may also feel numb. You may feel short of breath because of your expanding uterus. Other changes You may urinate more often because the fetus is moving lower into your pelvis and pressing on your bladder. You may have more problems sleeping. This may be caused by the size of your abdomen, an increased need to urinate, and an increase in your body's metabolism. You may notice the fetus "dropping," or moving lower in your abdomen (lightening). You may have increased vaginal discharge. You may notice that you have pain around your pelvic bone as your uterus distends. Follow these instructions at home: Medicines Follow your health care provider's instructions regarding medicine use. Specific medicines may be either safe or unsafe to take during pregnancy. Do not take any medicines unless approved by your health care provider. Take a prenatal vitamin that contains at least 600 micrograms (mcg) of folic acid. Eating  and drinking Eat a healthy diet that includes fresh fruits and vegetables, whole grains, good sources of protein such as meat, eggs, or tofu, and low-fat dairy products. Avoid raw meat and unpasteurized juice, milk, and cheese. These carry germs that can harm you and your baby. Eat 4 or 5 small meals rather than 3 large meals a day. You may need to take these actions to prevent or treat constipation: Drink enough fluid to keep your urine pale yellow. Eat foods that are high in fiber, such as beans, whole  grains, and fresh fruits and vegetables. Limit foods that are high in fat and processed sugars, such as fried or sweet foods. Activity Exercise only as directed by your health care provider. Most people can continue their usual exercise routine during pregnancy. Try to exercise for 30 minutes at least 5 days a week. Stop exercising if you experience contractions in the uterus. Stop exercising if you develop pain or cramping in the lower abdomen or lower back. Avoid heavy lifting. Do not exercise if it is very hot or humid or if you are at a high altitude. If you choose to, you may continue to have sex unless your health care provider tells you not to. Relieving pain and discomfort Take frequent breaks and rest with your legs raised (elevated) if you have leg cramps or low back pain. Take warm sitz baths to soothe any pain or discomfort caused by hemorrhoids. Use hemorrhoid cream if your health care provider approves. Wear a supportive bra to prevent discomfort from breast tenderness. If you develop varicose veins: Wear support hose as told by your health care provider. Elevate your feet for 15 minutes, 3-4 times a day. Limit salt in your diet. Safety Talk to your health care provider before traveling far distances. Do not use hot tubs, steam rooms, or saunas. Wear your seat belt at all times when driving or riding in a car. Talk with your health care provider if someone is verbally or physically abusive to you. Preparing for birth To prepare for the arrival of your baby: Take prenatal classes to understand, practice, and ask questions about labor and delivery. Visit the hospital and tour the maternity area. Purchase a rear-facing car seat and make sure you know how to install it in your car. Prepare the baby's room or sleeping area. Make sure to remove all pillows and stuffed animals from the baby's crib to prevent suffocation. General instructions Avoid cat litter boxes and soil used by  cats. These carry germs that can cause birth defects in the baby. If you have a cat, ask someone to clean the litter box for you. Do not douche or use tampons. Do not use scented sanitary pads. Do not use any products that contain nicotine or tobacco, such as cigarettes, e-cigarettes, and chewing tobacco. If you need help quitting, ask your health care provider. Do not use any herbal remedies, illegal drugs, or medicines that were not prescribed to you. Chemicals in these products can harm your baby. Do not drink alcohol. You will have more frequent prenatal exams during the third trimester. During a routine prenatal visit, your health care provider will do a physical exam, perform tests, and discuss your overall health. Keep all follow-up visits. This is important. Where to find more information American Pregnancy Association: americanpregnancy.org [employer]st[employer]ians and Gynecologists: https://www.todd-brady.net/ Office on [employer] Health: MightyReward.co.nz Contact a health care provider if you have: A fever. Mild pelvic cramps, pelvic pressure, or nagging pain in  your abdominal area or lower back. Vomiting or diarrhea. Bad-smelling vaginal discharge or foul-smelling urine. Pain when you urinate. A headache that does not go away when you take medicine. Visual changes or see spots in front of your eyes. Get help right away if: Your water breaks. You have regular contractions less than 5 minutes apart. You have spotting or bleeding from your vagina. You have severe abdominal pain. You have difficulty breathing. You have chest pain. You have fainting spells. You have not felt your baby move for the time period told by your health care provider. You have new or increased pain, swelling, or redness in an arm or leg. Summary The third trimester of pregnancy is from week 28 through week 40 (months 7 through 9). You may have more problems sleeping. This can be  caused by the size of your abdomen, an increased need to urinate, and an increase in your body's metabolism. You will have more frequent prenatal exams during the third trimester. Keep all follow-up visits. This is important. This information is not intended to replace advice given to you by your health care provider. Make sure you discuss any questions you have with your health care provider. Document Revised: 05/04/2020 Document Reviewed: 03/10/2020 Elsevier Patient Education  2023 [employer].

## 2022-09-06 NOTE — Progress Notes (Signed)
Kathy Salazar is a 31 y.o. [phone] at [redacted]w[redacted]d (via [place]) who presents to [facility] for routine follow up  Denies cramping/ctx, fluid leaking, vaginal bleeding, or decreased fetal movement. Taking PNV, also iron 325mg  every other day. Has noted some vaginal discharge and would like to be tested for yeast and BV. Denies abnormal odor or itching.  Primary Prenatal Care Provider: Dr. Larae Grooms  Postpartum Plans: - delivery planning: plans for SVD with epidural - circumcision: no - feeding: try breast & formula - pediatrician: [facility] - contraception: nexplanon  FHR: 143bpm Uterine size: 36cm  GBS and blind wet prep/gc/chl swabs obtained as patient could not tolerate speculum exam. Chaperone present - Delray Alt, CMA  Assessment & Plan  1. Routine prenatal care: - GBS, gc/chlamydia obtained today - fetal presentation confirmed vertex via [place] and bedside limited sono - strongly encouraged flu vaccine ASAP to protect her and her baby, she will think about it  2. Thrombocytopenia - platelets 126 on 8/30 - repeat CBC today  3. Anemia of pregnancy - Hgb 10.8 previously, on iron every other day - check CBC & ferritin today  4. Vaginal discharge - wet prep, gc/chl obtained today  Next prenatal visit in 1 week. Labor & fetal movement precautions discussed.  Kathy Netters, MD [place]

## 2022-09-07 LAB — CERVICOVAGINAL ANCILLARY ONLY
Chlamydia: NEGATIVE
Comment: NEGATIVE
Comment: NORMAL
Neisseria Gonorrhea: NEGATIVE

## 2022-09-09 LAB — CBC WITH DIFFERENTIAL/PLATELET
Basophils Absolute: 0 10*3/uL (ref 0.0–0.2)
Basos: 0 %
EOS (ABSOLUTE): 0.1 10*3/uL (ref 0.0–0.4)
Eos: 1 %
Hematocrit: 34 % (ref 34.0–46.6)
Hemoglobin: 10.8 g/dL — ABNORMAL LOW (ref 11.1–15.9)
Immature Grans (Abs): 0.1 10*3/uL (ref 0.0–0.1)
Immature Granulocytes: 1 %
Lymphocytes Absolute: 0.9 10*3/uL (ref 0.7–3.1)
Lymphs: 15 %
MCH: 27.1 pg (ref 26.6–33.0)
MCHC: 31.8 g/dL (ref 31.5–35.7)
MCV: 85 fL (ref 79–97)
Monocytes Absolute: 0.5 10*3/uL (ref 0.1–0.9)
Monocytes: 9 %
Neutrophils Absolute: 4.3 10*3/uL (ref 1.4–7.0)
Neutrophils: 74 %
Platelets: 138 10*3/uL — ABNORMAL LOW (ref 150–450)
RBC: 3.99 x10E6/uL (ref 3.77–5.28)
RDW: 13.3 % (ref 11.7–15.4)
WBC: 5.9 10*3/uL (ref 3.4–10.8)

## 2022-09-09 LAB — CULTURE, BETA STREP (GROUP B ONLY): Strep Gp B Culture: POSITIVE — AB

## 2022-09-09 LAB — FERRITIN: Ferritin: 9 ng/mL — ABNORMAL LOW (ref 15–150)

## 2022-09-11 ENCOUNTER — Encounter: Payer: Self-pay | Admitting: Family Medicine

## 2022-09-11 ENCOUNTER — Telehealth: Payer: Self-pay | Admitting: Family Medicine

## 2022-09-11 DIAGNOSIS — B951 Streptococcus, group B, as the cause of diseases classified elsewhere: Secondary | ICD-10-CM | POA: Insufficient documentation

## 2022-09-11 DIAGNOSIS — Z3493 Encounter for supervision of normal pregnancy, unspecified, third trimester: Secondary | ICD-10-CM

## 2022-09-11 MED ORDER — MICONAZOLE NITRATE 2 % VA CREA: 1.0000 | TOPICAL_CREAM | Freq: Every day | VAGINAL | 0 refills | Status: AC

## 2022-09-11 NOTE — Telephone Encounter (Signed)
Called patient to discuss results.    GBS positive - recommended going to hospital sooner rather than later when she thinks she may be in labor.  Hemoglobin 10.8 -ferritin also low.  Advised taking iron daily instead of every other day.  Platelets improved to 138, up from 126.  Wet prep with yeast- send in miconazole 7-day treatment  GC and Chlamydia were negative.  Patient appreciative, all questions answered. Leeanne Rio, MD

## 2022-09-13 ENCOUNTER — Encounter: Payer: Self-pay | Admitting: Family Medicine

## 2022-09-14 ENCOUNTER — Ambulatory Visit (INDEPENDENT_AMBULATORY_CARE_PROVIDER_SITE_OTHER): Payer: Self-pay | Admitting: Family Medicine

## 2022-09-14 VITALS — BP 109/72 | HR 84 | Wt 160.2 lb

## 2022-09-14 DIAGNOSIS — O99013 Anemia complicating pregnancy, third trimester: Secondary | ICD-10-CM

## 2022-09-14 DIAGNOSIS — Z3493 Encounter for supervision of normal pregnancy, unspecified, third trimester: Secondary | ICD-10-CM

## 2022-09-14 NOTE — Progress Notes (Signed)
  [place] Prenatal Visit  Mafalda Mcginniss is a 31 y.o. [phone] at [redacted]w[redacted]d here for routine follow up. She is dated by [redacted]w[redacted]d ultrasound.  She reports no complaints. She reports fetal movement. She denies vaginal bleeding, contractions, or loss of fluid. See flow sheet for details.  Contractions yesterday every 10 minutes. None today   There were no vitals filed for this visit.  A/P: Pregnancy at [redacted]w[redacted]d.  Doing well.   Routine prenatal care  Dating reviewed, dating tab is correct Fetal heart tones Appropriate 143 bpm Fundal height within expected range.   Fetal position confirmed Vertex using Ultrasound .  GBS positive  Repeat GC/CT already obtained. Negative  The patient does not have a history of HSV and valacyclovir is not indicated at this time.  Infant feeding choice: Both  Contraception choice: Nexplanon  Infant circumcision desired no Influenza vaccine not administered as patient declined, will continue to discuss.   Tdap previously administered between 27-36 weeks  Pregnancy education regarding preterm labor, fetal movement,  benefits of breastfeeding, contraception, fetal growth, expected weight gain, and safe infant sleep were discussed.    2. Pregnancy issues include the following and were addressed as appropriate today:  - Anemia of pregnancy- Ferritin on 9/28 9. on daily iron supplement  - GBS positive. Will need abx prior to delivery  - Thrombocytopenia- platelets 138 on 9/28 - Problem list and pregnancy box updated: Yes.   Follow up 1 week.

## 2022-09-20 ENCOUNTER — Ambulatory Visit (INDEPENDENT_AMBULATORY_CARE_PROVIDER_SITE_OTHER): Payer: Self-pay | Admitting: Family Medicine

## 2022-09-20 VITALS — BP 102/68 | HR 84 | Wt 161.0 lb

## 2022-09-20 DIAGNOSIS — Z3492 Encounter for supervision of normal pregnancy, unspecified, second trimester: Secondary | ICD-10-CM

## 2022-09-20 NOTE — Patient Instructions (Signed)
Please make a follow up appointment in 1 weeks.  Prenatal Classes Go to [email or web address] for more information on the pregnancy and child birth classes that [place] has to offer.   Pregnancy Related Return Precautions The follow are signs/symptoms that are abnormal in pregnancy and may require further evaluation by a physician: Go to the MAU at [facility] [place] if: You have cramping/contractions that do not go away with drinking water, especially if they are lasting 30 seconds to 1.5 minutes, coming and going every 5-10 minutes for an hour or more, or are getting stronger and you cannot walk or talk while having a contraction/cramp. Your water breaks.  Sometimes it is a big gush of fluid, sometimes it is just a trickle that keeps getting your underwear wet or running down your legs You have vaginal bleeding.    You do not feel your baby moving like normal.  If you do not, get something to eat and drink (something cold or something with sugar like peanut butter or juice) and lay down and focus on feeling your baby move. If your baby is still not moving like normal, you should go to MAU. You should feel your baby move 6 times in one hour, or 10 times in two hours. You have a persistent headache that does not go away with 1 g of Tylenol, vision changes, chest pain, difficulty breathing, severe pain in your right upper abdomen, worsening leg swelling- these can all be signs of high blood pressure in pregnancy and need to be evaluated by a provider immediately  These are all concerning in pregnancy and if you have any of these I recommend you call your PCP and present to the Maternity Admissions Unit (map below) for further evaluation.  For any pregnancy-related emergencies, please go to the Maternity Admissions Unit in the [facility] at [facility]. You will use hospital Entrance C.    Our clinic number is [phone].   Dr Joquan Lotz  

## 2022-09-20 NOTE — Progress Notes (Signed)
Kathy Salazar is a 31 y.o. [phone] at [redacted]w[redacted]d (via [place]) who presents to [facility] for routine follow up. Seen today along with resident Dr. Gwendolyn Lima. Prenatal course, history, notes, ultrasounds, and laboratory results reviewed.  Denies cramping/ctx, fluid leaking, vaginal bleeding, or decreased fetal movement. Taking PNV.    Primary Prenatal Care Provider: Dr Larae Grooms  Postpartum Plans: - delivery planning: SVD with epidural - circumcision: declines - feeding: both - pediatrician: [facility] - contraception: Nexplanon  FHR: 141 bpm Uterine size: 38  Assessment & Plan  1. Routine prenatal care: - GBS positive- discsused going to hospital early when contractions start - declined flu vaccine today  2. Thrombocytopenia- likely gestational most recent 138, stable.  3. Anemia of pregnancy- most recent CBC 10.8 with low ferritin, taking iron daily.   Next prenatal visit in 1 weeks - offer SVE, needs BPP scheduled at 40.1 Wga and post dates induction at 41.0 WGA set up . Labor & fetal movement precautions and pre-eclampsia precautions discussed.  Yehuda Savannah, MD [place]

## 2022-09-25 ENCOUNTER — Encounter: Payer: Self-pay | Admitting: Family Medicine

## 2022-09-26 ENCOUNTER — Ambulatory Visit (INDEPENDENT_AMBULATORY_CARE_PROVIDER_SITE_OTHER): Payer: Self-pay | Admitting: Family Medicine

## 2022-09-26 ENCOUNTER — Other Ambulatory Visit: Payer: Self-pay

## 2022-09-26 VITALS — BP 108/68 | HR 79 | Wt 160.8 lb

## 2022-09-26 DIAGNOSIS — Z3493 Encounter for supervision of normal pregnancy, unspecified, third trimester: Secondary | ICD-10-CM

## 2022-09-26 NOTE — Progress Notes (Signed)
  [facility] Prenatal Visit  Kathy Salazar is a 31 y.o. [phone] at [redacted]w[redacted]d here for routine follow up. She is dated by early ultrasound.  She reports no complaints. She reports fetal movement. She denies vaginal bleeding, contractions, or loss of fluid. See flow sheet for details.  Vitals:   09/26/22 0955  BP: 108/68  Pulse: 79    A/P: Pregnancy at [redacted]w[redacted]d.  Doing well.   Routine prenatal care:  Dating reviewed, dating tab is correct. Fetal heart tones appropriate. Fundal height appropriate. Fetal position confirmed vertex using [place].  Infant feeding choice: Both Contraception choice: nexplanon  Infant circumcision desired: no  Pain control in labor discussed and patient desires SVD with epidural.  Influenza vaccine no as patient politely declines.   Tdap previously administered between 27-36 weeks  GBS and gc/chlamydia testing results were reviewed today.  Discussed GBS positive result and what this means.  Pregnancy education regarding labor, fetal movement,  benefits of breastfeeding, contraception, and safe infant sleep were discussed.  Labor and fetal movement precautions reviewed. Induction of labor discussed. BPP scheduled at 40 weeks. Will need induction scheduled at next visit.   2. Pregnancy issues include the following and were addressed as appropriate today:   PHQ-9 score of 0 reviewed.   Problem list and pregnancy box updated: Yes.    Follow up 1 week for next prenatal visit. MAU precautions discussed and provided. Please schedule post dates induction at next visit for 41 weeks.

## 2022-09-26 NOTE — Patient Instructions (Addendum)
It was great seeing you today!  Everything looks good so far!   Go to the MAU at [place] at [facility] if: You begin to have strong, frequent contractions Your water breaks.  Sometimes it is a big gush of fluid, sometimes it is just a trickle that keeps getting your underwear wet or running down your legs You have vaginal bleeding.  It is normal to have a small amount of spotting if your cervix was checked.  You do not feel your baby moving like normal.  If you do not, get something to eat and drink and lay down and focus on feeling your baby move.   If your baby is still not moving like normal, you should go to MAU.   Your appointment for the BPP is 10/23 at 10:30 am.  Please follow up at your next scheduled appointment in 1 week, if anything arises between now and then, please don't hesitate to contact our office.   Thank you for allowing [place] to be a part of your medical care!  Thank you, Dr. Larae Grooms

## 2022-10-01 ENCOUNTER — Other Ambulatory Visit: Payer: Self-pay | Admitting: Family Medicine

## 2022-10-01 ENCOUNTER — Ambulatory Visit: Payer: Self-pay | Admitting: *Deleted

## 2022-10-01 ENCOUNTER — Ambulatory Visit: Payer: Medicaid Other | Attending: Obstetrics and Gynecology

## 2022-10-01 ENCOUNTER — Other Ambulatory Visit: Payer: Self-pay | Admitting: *Deleted

## 2022-10-01 VITALS — BP 115/67 | HR 80

## 2022-10-01 DIAGNOSIS — Z3493 Encounter for supervision of normal pregnancy, unspecified, third trimester: Secondary | ICD-10-CM

## 2022-10-01 DIAGNOSIS — Z3A39 39 weeks gestation of pregnancy: Secondary | ICD-10-CM | POA: Insufficient documentation

## 2022-10-01 DIAGNOSIS — O283 Abnormal ultrasonic finding on antenatal screening of mother: Secondary | ICD-10-CM | POA: Insufficient documentation

## 2022-10-01 DIAGNOSIS — O403XX0 Polyhydramnios, third trimester, not applicable or unspecified: Secondary | ICD-10-CM

## 2022-10-01 DIAGNOSIS — Z362 Encounter for other antenatal screening follow-up: Secondary | ICD-10-CM | POA: Diagnosis not present

## 2022-10-01 DIAGNOSIS — O480 Post-term pregnancy: Secondary | ICD-10-CM

## 2022-10-03 ENCOUNTER — Encounter: Payer: Self-pay | Admitting: Family Medicine

## 2022-10-03 ENCOUNTER — Ambulatory Visit (INDEPENDENT_AMBULATORY_CARE_PROVIDER_SITE_OTHER): Payer: Medicaid Other | Admitting: Student

## 2022-10-03 ENCOUNTER — Other Ambulatory Visit: Payer: Self-pay | Admitting: Advanced Practice Midwife

## 2022-10-03 VITALS — BP 110/70 | HR 82 | Wt 164.6 lb

## 2022-10-03 DIAGNOSIS — Z3493 Encounter for supervision of normal pregnancy, unspecified, third trimester: Secondary | ICD-10-CM

## 2022-10-03 DIAGNOSIS — Z3A40 40 weeks gestation of pregnancy: Secondary | ICD-10-CM

## 2022-10-03 DIAGNOSIS — O480 Post-term pregnancy: Secondary | ICD-10-CM

## 2022-10-03 NOTE — Progress Notes (Signed)
  [facility] Prenatal Visit  Kathy Salazar is a 31 y.o. [phone] at [redacted]w[redacted]d here for routine follow up. She is dated by early ultrasound.  She reports no complaints, but very ready to have this baby. She reports fetal movement. She denies vaginal bleeding, contractions, or loss of fluid.  See flow sheet for details.  Vitals:   10/03/22 0919  BP: 110/70  Pulse: 82     A/P: Pregnancy at [redacted]w[redacted]d.  Doing well.   Routine prenatal care:  Dating reviewed, dating tab is correct Fetal heart tones Appropriate-150s Fundal height within expected range.  Fetal position confirmed Vertex.  Infant feeding choice: Formula feeding  Contraception choice: Nexplanon /IUD Infant circumcision desired no Pain control in labor discussed and patient desires SVD with epidural.  Tdap previously administered between 27-36 weeks  Biophysical profile is scheduled between 40w0 day and [redacted]w[redacted]d. Induction of labor discussed with patient. Discussed plan with preceptor. Induction scheduled for 41 weeks on October 31st, 2023 during daytime.  Labor and fetal movement precautions reviewed.  2. Pregnancy issues include the following and were addressed as appropriate today:   PHQ-9 reviewed, score 0.  Problem list and pregnancy box updated: Yes.   Completed cervical check today- fingertip, 50% effaced. Reviewed BPP on 10/23 showing anterior placenta, AFI 8 cm (subjectively low-normal) with 8/8 BPP. MAU precautions discussed and provided. Post-dates IOL scheduled at [redacted]w[redacted]d on 10/09/22. I will plan to be present at delivery.

## 2022-10-03 NOTE — Patient Instructions (Addendum)
We scheduled an induction for October 31st. You will get a call and they will tell you when to arrive to the hospital.  Continue taking your iron pill and PNV.    Pregnancy Related Return Precautions The follow are signs/symptoms that are abnormal in pregnancy and may require further evaluation by a physician: Go to the MAU at [place] at [facility] if: You have cramping/contractions that do not go away with drinking water, especially if they are lasting 30 seconds to 1.5 minutes, coming and going every 5-10 minutes for an hour or more, or are getting stronger and you cannot walk or talk while having a contraction/cramp. Your water breaks.  Sometimes it is a big gush of fluid, sometimes it is just a trickle that keeps getting your underwear wet or running down your legs You have vaginal bleeding.    You do not feel your baby moving like normal.  If you do not, get something to eat and drink (something cold or something with sugar like peanut butter or juice) and lay down and focus on feeling your baby move. If your baby is still not moving like normal, you should go to MAU. You should feel your baby move 6 times in one hour, or 10 times in two hours. You have a persistent headache that does not go away with 1 g of Tylenol, vision changes, chest pain, difficulty breathing, severe pain in your right upper abdomen, worsening leg swelling- these can all be signs of high blood pressure in pregnancy and need to be evaluated by a provider immediately   These are all concerning in pregnancy and if you have any of these I recommend you call your PCP and present to the Maternity Admissions Unit (map below) for further evaluation.   For any pregnancy-related emergencies, please go to the Maternity Admissions Unit in the [facility] at [place] will use hospital Entrance C.      Our clinic number is [phone].    Dr. Owens Shark

## 2022-10-08 ENCOUNTER — Telehealth (HOSPITAL_COMMUNITY): Payer: Self-pay | Admitting: *Deleted

## 2022-10-08 ENCOUNTER — Encounter (HOSPITAL_COMMUNITY): Payer: Self-pay | Admitting: *Deleted

## 2022-10-08 NOTE — Telephone Encounter (Signed)
Preadmission screen  

## 2022-10-09 ENCOUNTER — Inpatient Hospital Stay (HOSPITAL_COMMUNITY): Payer: Medicaid Other

## 2022-10-09 ENCOUNTER — Ambulatory Visit: Payer: Self-pay

## 2022-10-09 ENCOUNTER — Encounter (HOSPITAL_COMMUNITY): Payer: Self-pay | Admitting: Family Medicine

## 2022-10-09 ENCOUNTER — Inpatient Hospital Stay (HOSPITAL_COMMUNITY)
Admission: RE | Admit: 2022-10-09 | Discharge: 2022-10-11 | DRG: 806 | Disposition: A | Discharge status: Home / Self Care | Payer: Medicaid Other | Attending: Obstetrics and Gynecology | Admitting: Obstetrics and Gynecology

## 2022-10-09 ENCOUNTER — Inpatient Hospital Stay (HOSPITAL_COMMUNITY): Payer: Medicaid Other | Admitting: Anesthesiology

## 2022-10-09 ENCOUNTER — Other Ambulatory Visit: Payer: Self-pay

## 2022-10-09 DIAGNOSIS — O9912 Other diseases of the blood and blood-forming organs and certain disorders involving the immune mechanism complicating childbirth: Secondary | ICD-10-CM | POA: Diagnosis present

## 2022-10-09 DIAGNOSIS — Z3A40 40 weeks gestation of pregnancy: Secondary | ICD-10-CM

## 2022-10-09 DIAGNOSIS — O99824 Streptococcus B carrier state complicating childbirth: Secondary | ICD-10-CM | POA: Diagnosis present

## 2022-10-09 DIAGNOSIS — O99019 Anemia complicating pregnancy, unspecified trimester: Secondary | ICD-10-CM | POA: Diagnosis present

## 2022-10-09 DIAGNOSIS — D696 Thrombocytopenia, unspecified: Secondary | ICD-10-CM | POA: Diagnosis present

## 2022-10-09 DIAGNOSIS — D6959 Other secondary thrombocytopenia: Secondary | ICD-10-CM | POA: Diagnosis present

## 2022-10-09 DIAGNOSIS — O9902 Anemia complicating childbirth: Secondary | ICD-10-CM | POA: Diagnosis present

## 2022-10-09 DIAGNOSIS — O480 Post-term pregnancy: Secondary | ICD-10-CM | POA: Diagnosis present

## 2022-10-09 DIAGNOSIS — B951 Streptococcus, group B, as the cause of diseases classified elsewhere: Secondary | ICD-10-CM | POA: Diagnosis present

## 2022-10-09 LAB — CBC
HCT: 37.3 % (ref 36.0–46.0)
HCT: 34.8 % — ABNORMAL LOW (ref 36.0–46.0)
Hemoglobin: 12.3 g/dL (ref 12.0–15.0)
Hemoglobin: 11.7 g/dL — ABNORMAL LOW (ref 12.0–15.0)
MCH: 28.7 pg (ref 26.0–34.0)
MCH: 29.3 pg (ref 26.0–34.0)
MCHC: 33 g/dL (ref 30.0–36.0)
MCHC: 33.6 g/dL (ref 30.0–36.0)
MCV: 86.9 fL (ref 80.0–100.0)
MCV: 87 fL (ref 80.0–100.0)
Platelets: 126 10*3/uL — ABNORMAL LOW (ref 150–400)
Platelets: 126 10*3/uL — ABNORMAL LOW (ref 150–400)
RBC: 4.29 MIL/uL (ref 3.87–5.11)
RBC: 4 MIL/uL (ref 3.87–5.11)
RDW: 15.5 % (ref 11.5–15.5)
RDW: 15.4 % (ref 11.5–15.5)
WBC: 5.1 10*3/uL (ref 4.0–10.5)
WBC: 9.4 10*3/uL (ref 4.0–10.5)
nRBC: 0 % (ref 0.0–0.2)
nRBC: 0 % (ref 0.0–0.2)

## 2022-10-09 LAB — RPR: RPR Ser Ql: NONREACTIVE

## 2022-10-09 LAB — TYPE AND SCREEN
ABO/RH(D): A POS
Antibody Screen: NEGATIVE

## 2022-10-09 MED ORDER — LIDOCAINE HCL (PF) 1 % IJ SOLN
INTRAMUSCULAR | Status: DC | PRN
  Administered 2022-10-09: 8 mL via EPIDURAL

## 2022-10-09 MED ORDER — OXYTOCIN BOLUS FROM INFUSION
333.0000 mL | Freq: Once | INTRAVENOUS | Status: AC
  Administered 2022-10-09: 333 mL via INTRAVENOUS

## 2022-10-09 MED ORDER — COCONUT OIL OIL: 1.0000 | TOPICAL_OIL | Status: DC | PRN

## 2022-10-09 MED ORDER — BENZOCAINE-MENTHOL 20-0.5 % EX AERO
1.0000 | INHALATION_SPRAY | CUTANEOUS | Status: DC | PRN
  Administered 2022-10-09 – 2022-10-11 (×2): 1 via TOPICAL
  Filled 2022-10-09 (×2): qty 56

## 2022-10-09 MED ORDER — DIPHENHYDRAMINE HCL 50 MG/ML IJ SOLN: 12.5000 mg | INTRAMUSCULAR | Status: DC | PRN

## 2022-10-09 MED ORDER — EPHEDRINE 5 MG/ML INJ: 10.0000 mg | INTRAVENOUS | Status: DC | PRN

## 2022-10-09 MED ORDER — DIBUCAINE (PERIANAL) 1 % EX OINT: 1.0000 | TOPICAL_OINTMENT | CUTANEOUS | Status: DC | PRN

## 2022-10-09 MED ORDER — FENTANYL CITRATE (PF) 100 MCG/2ML IJ SOLN: 100.0000 ug | INTRAMUSCULAR | Status: DC | PRN

## 2022-10-09 MED ORDER — SOD CITRATE-CITRIC ACID 500-334 MG/5ML PO SOLN: 30.0000 mL | ORAL | Status: DC | PRN

## 2022-10-09 MED ORDER — PENICILLIN G POT IN DEXTROSE 60000 UNIT/ML IV SOLN
3.0000 10*6.[IU] | INTRAVENOUS | Status: DC
  Administered 2022-10-09 (×2): 3 10*6.[IU] via INTRAVENOUS
  Filled 2022-10-09 (×2): qty 50

## 2022-10-09 MED ORDER — SENNOSIDES-DOCUSATE SODIUM 8.6-50 MG PO TABS
2.0000 | ORAL_TABLET | Freq: Every day | ORAL | Status: DC
  Administered 2022-10-10 – 2022-10-11 (×2): 2 via ORAL
  Filled 2022-10-09 (×2): qty 2

## 2022-10-09 MED ORDER — LACTATED RINGERS IV SOLN: INTRAVENOUS | Status: DC

## 2022-10-09 MED ORDER — OXYTOCIN-SODIUM CHLORIDE 30-0.9 UT/500ML-% IV SOLN
2.5000 [IU]/h | INTRAVENOUS | Status: DC
  Administered 2022-10-09: 2.5 [IU]/h via INTRAVENOUS
  Filled 2022-10-09: qty 500

## 2022-10-09 MED ORDER — SODIUM CHLORIDE 0.9 % IV SOLN
5.0000 10*6.[IU] | Freq: Once | INTRAVENOUS | Status: AC
  Administered 2022-10-09: 5 10*6.[IU] via INTRAVENOUS
  Filled 2022-10-09: qty 5

## 2022-10-09 MED ORDER — OXYTOCIN-SODIUM CHLORIDE 30-0.9 UT/500ML-% IV SOLN
1.0000 m[IU]/min | INTRAVENOUS | Status: DC
  Administered 2022-10-09: 2 m[IU]/min via INTRAVENOUS

## 2022-10-09 MED ORDER — PHENYLEPHRINE 80 MCG/ML (10ML) SYRINGE FOR IV PUSH (FOR BLOOD PRESSURE SUPPORT)
80.0000 ug | PREFILLED_SYRINGE | INTRAVENOUS | Status: DC | PRN
  Filled 2022-10-09: qty 10

## 2022-10-09 MED ORDER — ONDANSETRON HCL 4 MG/2ML IJ SOLN: 4.0000 mg | Freq: Four times a day (QID) | INTRAMUSCULAR | Status: DC | PRN

## 2022-10-09 MED ORDER — ACETAMINOPHEN 325 MG PO TABS
650.0000 mg | ORAL_TABLET | ORAL | Status: DC | PRN
  Administered 2022-10-10 – 2022-10-11 (×3): 650 mg via ORAL
  Filled 2022-10-09 (×3): qty 2

## 2022-10-09 MED ORDER — PHENYLEPHRINE 80 MCG/ML (10ML) SYRINGE FOR IV PUSH (FOR BLOOD PRESSURE SUPPORT)
80.0000 ug | PREFILLED_SYRINGE | INTRAVENOUS | Status: DC | PRN
  Administered 2022-10-09: 80 ug via INTRAVENOUS

## 2022-10-09 MED ORDER — FENTANYL-BUPIVACAINE-NACL 0.5-0.125-0.9 MG/250ML-% EP SOLN: 12.0000 mL/h | EPIDURAL | Status: DC | PRN

## 2022-10-09 MED ORDER — ZOLPIDEM TARTRATE 5 MG PO TABS: 5.0000 mg | ORAL_TABLET | Freq: Every evening | ORAL | Status: DC | PRN

## 2022-10-09 MED ORDER — MISOPROSTOL 25 MCG QUARTER TABLET
25.0000 ug | ORAL_TABLET | Freq: Once | ORAL | Status: AC
  Administered 2022-10-09: 25 ug via VAGINAL
  Filled 2022-10-09: qty 1

## 2022-10-09 MED ORDER — TETANUS-DIPHTH-ACELL PERTUSSIS 5-2.5-18.5 LF-MCG/0.5 IM SUSY: 0.5000 mL | PREFILLED_SYRINGE | Freq: Once | INTRAMUSCULAR | Status: DC

## 2022-10-09 MED ORDER — ONDANSETRON HCL 4 MG PO TABS: 4.0000 mg | ORAL_TABLET | ORAL | Status: DC | PRN

## 2022-10-09 MED ORDER — LACTATED RINGERS IV SOLN: 500.0000 mL | Freq: Once | INTRAVENOUS | Status: DC

## 2022-10-09 MED ORDER — MISOPROSTOL 50MCG HALF TABLET: 50.0000 ug | ORAL_TABLET | ORAL | Status: DC | PRN

## 2022-10-09 MED ORDER — LIDOCAINE HCL (PF) 1 % IJ SOLN: 30.0000 mL | INTRAMUSCULAR | Status: DC | PRN

## 2022-10-09 MED ORDER — PRENATAL MULTIVITAMIN CH
1.0000 | ORAL_TABLET | Freq: Every day | ORAL | Status: DC
  Administered 2022-10-10 – 2022-10-11 (×2): 1 via ORAL
  Filled 2022-10-09 (×2): qty 1

## 2022-10-09 MED ORDER — MISOPROSTOL 50MCG HALF TABLET: 50.0000 ug | ORAL_TABLET | Freq: Once | ORAL | Status: DC

## 2022-10-09 MED ORDER — ACETAMINOPHEN 325 MG PO TABS: 650.0000 mg | ORAL_TABLET | ORAL | Status: DC | PRN

## 2022-10-09 MED ORDER — MISOPROSTOL 50MCG HALF TABLET
50.0000 ug | ORAL_TABLET | Freq: Once | ORAL | Status: AC
  Administered 2022-10-09: 50 ug via ORAL
  Filled 2022-10-09: qty 1

## 2022-10-09 MED ORDER — LACTATED RINGERS IV SOLN: 500.0000 mL | INTRAVENOUS | Status: DC | PRN

## 2022-10-09 MED ORDER — OXYCODONE-ACETAMINOPHEN 5-325 MG PO TABS: 1.0000 | ORAL_TABLET | ORAL | Status: DC | PRN

## 2022-10-09 MED ORDER — OXYCODONE-ACETAMINOPHEN 5-325 MG PO TABS: 2.0000 | ORAL_TABLET | ORAL | Status: DC | PRN

## 2022-10-09 MED ORDER — FENTANYL-BUPIVACAINE-NACL 0.5-0.125-0.9 MG/250ML-% EP SOLN
12.0000 mL/h | EPIDURAL | Status: DC | PRN
  Administered 2022-10-09: 12 mL/h via EPIDURAL
  Filled 2022-10-09: qty 250

## 2022-10-09 MED ORDER — MISOPROSTOL 25 MCG QUARTER TABLET: 25.0000 ug | ORAL_TABLET | Freq: Once | ORAL | Status: DC

## 2022-10-09 MED ORDER — WITCH HAZEL-GLYCERIN EX PADS: 1.0000 | MEDICATED_PAD | CUTANEOUS | Status: DC | PRN

## 2022-10-09 MED ORDER — IBUPROFEN 600 MG PO TABS
600.0000 mg | ORAL_TABLET | Freq: Four times a day (QID) | ORAL | Status: DC
  Administered 2022-10-09 – 2022-10-11 (×7): 600 mg via ORAL
  Filled 2022-10-09 (×7): qty 1

## 2022-10-09 MED ORDER — SIMETHICONE 80 MG PO CHEW: 80.0000 mg | CHEWABLE_TABLET | ORAL | Status: DC | PRN

## 2022-10-09 MED ORDER — ONDANSETRON HCL 4 MG/2ML IJ SOLN: 4.0000 mg | INTRAMUSCULAR | Status: DC | PRN

## 2022-10-09 MED ORDER — TERBUTALINE SULFATE 1 MG/ML IJ SOLN: 0.2500 mg | Freq: Once | INTRAMUSCULAR | Status: DC | PRN

## 2022-10-09 MED ORDER — MISOPROSTOL 25 MCG QUARTER TABLET
25.0000 ug | ORAL_TABLET | ORAL | Status: DC | PRN
  Filled 2022-10-09: qty 1

## 2022-10-09 MED ORDER — DIPHENHYDRAMINE HCL 25 MG PO CAPS: 25.0000 mg | ORAL_CAPSULE | Freq: Four times a day (QID) | ORAL | Status: DC | PRN

## 2022-10-09 NOTE — H&P (Signed)
LABOR AND DELIVERY ADMISSION HISTORY AND PHYSICAL NOTE  Kathy Salazar is a 31 y.o. female [phone] with IUP at [redacted]w[redacted]d by 12 wk u/s presenting for late term induction of labor   She reports positive fetal movement. She denies leakage of fluid or vaginal bleeding.  Prenatal History/Complications:  Past Medical History: Past Medical History:  Diagnosis Date   No pertinent past medical history     Past Surgical History: Past Surgical History:  Procedure Laterality Date   APPENDECTOMY      Obstetrical History: OB History     Gravida  4   Para  2   Term  2   Preterm  0   AB  1   Living  2      SAB  1   IAB  0   Ectopic  0   Multiple  0   Live Births  2           Social History: Social History   Socioeconomic History   Marital status: Single    Spouse name: Not on file   Number of children: Not on file   Years of education: Not on file   Highest education level: Not on file  Occupational History   Not on file  Tobacco Use   Smoking status: Never   Smokeless tobacco: Never  Vaping Use   Vaping Use: Never used  Substance and Sexual Activity   Alcohol use: Yes    Comment: occasionally   Drug use: No   Sexual activity: Yes    Birth control/protection: Pill  Other Topics Concern   Not on file  Social History Narrative   Not on file   Social Determinants of Health   Financial Resource Strain: Not on file  Food Insecurity: No Food Insecurity (10/09/2022)   Hunger Vital Sign    Worried About Running Out of Food in the Last Year: Never true    Ran Out of Food in the Last Year: Never true  Transportation Needs: No Transportation Needs (10/09/2022)   PRAPARE - Hydrologist (Medical): No    Lack of Transportation (Non-Medical): No  Physical Activity: Not on file  Stress: Not on file  Social Connections: Not on file    Family History: Family History  Problem Relation Age of Onset   91 / 92  Sister    Hyperlipidemia Sister     Allergies: No Known Allergies  Medications Prior to Admission  Medication Sig Dispense Refill Last Dose   ferrous sulfate 325 (65 FE) MG tablet Take 1 tablet (325 mg total) by mouth every other day. 45 tablet 1    Prenatal Vit-Fe Fumarate-FA (MULTIVITAMIN-PRENATAL) 27-0.8 MG TABS tablet Take 1 tablet by mouth daily at 12 noon.        Review of Systems   All systems reviewed and negative except as stated in HPI  Blood pressure 125/71, pulse 93, temperature 98.3 F (36.8 C), temperature source Oral, resp. rate 16, height 5\' 3"  (1.6 m), weight 74.9 kg, last menstrual period 05/11/2021, unknown if currently breastfeeding. General appearance: alert, cooperative, and appears stated age Lungs: clear to auscultation bilaterally Heart: regular rate and rhythm Abdomen: soft, non-tender; bowel sounds normal Extremities: No calf swelling or tenderness Presentation: cephalic Fetal monitoring: 140/mod/+a/-d Uterine activity: quiet Dilation: 2 Effacement (%): Thick Station: -2 Exam by:: Davis,RN   Prenatal labs: ABO, Rh: --/--/PENDING (10/31 0935) Antibody: PENDING (10/31 0935) Rubella: 1.39 (03/22 1617) RPR: Non Reactive (08/04  1236)  HBsAg: Negative (03/22 1617)  HIV: Non Reactive (08/04 1236)  GBS: Positive/-- (09/28 1123)  1- hour GST abnormal, 3-hour normal Genetic screening:  declined Anatomy US: normal  Prenatal Transfer Tool  Maternal Diabetes: No Genetic Screening: Declined Maternal Ultrasounds/Referrals: Normal Fetal Ultrasounds or other Referrals:  None Maternal Substance Abuse:  No Significant Maternal Medications:  None Significant Maternal Lab Results: Group B Strep positive  Results for orders placed or performed during the hospital encounter of 10/09/22 (from the past 24 hour(s))  CBC   Collection Time: 10/09/22  9:35 AM  Result Value Ref Range   WBC 5.1 4.0 - 10.5 K/uL   RBC 4.29 3.87 - 5.11 MIL/uL   Hemoglobin 12.3  12.0 - 15.0 g/dL   HCT 83.0 94.0 - 76.8 %   MCV 86.9 80.0 - 100.0 fL   MCH 28.7 26.0 - 34.0 pg   MCHC 33.0 30.0 - 36.0 g/dL   RDW 08.8 11.0 - 31.5 %   Platelets 126 (L) 150 - 400 K/uL   nRBC 0.0 0.0 - 0.2 %  Type and screen   Collection Time: 10/09/22  9:35 AM  Result Value Ref Range   ABO/RH(D) PENDING    Antibody Screen PENDING    Sample Expiration      10/12/2022,2359 Performed at [facility], [address], [place], [place] [address]     Patient Active Problem List   Diagnosis Date Noted   Post term pregnancy over 40 weeks 10/09/2022   Positive GBS test 09/11/2022   Shingles 08/28/2022   Headache 08/28/2022   Anemia in pregnancy 08/09/2022   Abnormal glucose tolerance test during pregnancy, not yet delivered 06/28/2022   Supervision of low-risk pregnancy 03/07/2022   Carpal tunnel syndrome 01/13/2020   Palpitations with regular cardiac rhythm 01/13/2020   PCOS (polycystic ovarian syndrome) 01/12/2020   Gestational thrombocytopenia (HCC) 05/22/2016    Assessment: Kathy Salazar is a 31 y.o. [id number] at [redacted]w[redacted]d here for late term induction of labor. Per rn exam 2 cm dilated, cervix stretchy, will try cytotec to start, foley if needed  #Gestational thrombocytopenia, anemia: f/u cbc #Labor: cytotec po/vaginal to start #Pain: Considering epidural #FWB: Cat 1 #ID:  Gbs positive, penicillin ordered #MOF: bottle #MOC: outpatient nexplanon #Circ:  declines  Rohini Jaroszewski B Brenisha Tsui 10/09/2022, 10:07 AM

## 2022-10-09 NOTE — Anesthesia Procedure Notes (Signed)
Epidural Patient location during procedure: OB Start time: 10/09/2022 3:17 PM End time: 10/09/2022 3:21 PM  Staffing Anesthesiologist: Janeece Riggers, MD  Preanesthetic Checklist Completed: patient identified, IV checked, site marked, risks and benefits discussed, surgical consent, monitors and equipment checked, pre-op evaluation and timeout performed  Epidural Patient position: sitting Prep: DuraPrep and site prepped and draped Patient monitoring: continuous pulse ox and blood pressure Approach: midline Location: L3-L4 Injection technique: LOR air  Needle:  Needle type: Tuohy  Needle gauge: 17 G Needle length: 9 cm and 9 Needle insertion depth: 5 cm Catheter type: closed end flexible Catheter size: 19 Gauge Catheter at skin depth: 10 cm Test dose: negative  Assessment Events: blood not aspirated, injection not painful, no injection resistance, no paresthesia and negative IV test

## 2022-10-09 NOTE — Progress Notes (Signed)
Cervical exam by RN 4 hours after initial doses oral/vaginal cytotec 4.5 cm dilated, tracing remains category 1, will plan on starting pitocin after placement of epidural.

## 2022-10-09 NOTE — Progress Notes (Signed)
LABOR PROGRESS NOTE  Kathy Salazar is a 31 y.o. [id number] at [redacted]w[redacted]d  admitted for IOL PD.   Subjective: Patient reports leakage of fluid.   Objective: BP 114/69   Pulse (!) 227   Temp 98.3 F (36.8 C) (Oral)   Resp 18   Ht 5\' 3"  (1.6 m)   Wt 74.9 kg   LMP 05/11/2021   SpO2 99%   BMI 29.26 kg/m  or  Vitals:   10/09/22 1700 10/09/22 1730 10/09/22 1800 10/09/22 1830  BP: (!) 99/50 102/69 (!) 101/53 114/69  Pulse: 67 67 69 (!) 227  Resp:      Temp:      TempSrc:      SpO2:      Weight:      Height:        SVE Dilation: 6 Effacement (%): 100 Cervical Position: Middle Station: -2 Presentation: Vertex Exam by:: resident Dr. Gwendolyn Lima FHT: baseline rate 140, moderate varibility, +acel, 2 variable decels Toco: every 2-3 minutes   Labs: Lab Results  Component Value Date   WBC 5.1 10/09/2022   HGB 12.3 10/09/2022   HCT 37.3 10/09/2022   MCV 86.9 10/09/2022   PLT 126 (L) 10/09/2022    Patient Active Problem List   Diagnosis Date Noted   Post term pregnancy over 40 weeks 10/09/2022   Positive GBS test 09/11/2022   Shingles 08/28/2022   Headache 08/28/2022   Anemia in pregnancy 08/09/2022   Abnormal glucose tolerance test during pregnancy, not yet delivered 06/28/2022   Supervision of low-risk pregnancy 03/07/2022   Carpal tunnel syndrome 01/13/2020   Palpitations with regular cardiac rhythm 01/13/2020   PCOS (polycystic ovarian syndrome) 01/12/2020   Gestational thrombocytopenia ([place]) 05/22/2016    Assessment / Plan: 31 y.o. [id number] at [redacted]w[redacted]d here for IOL PD  Labor: SROM, Continue to monitor continue pitocin  Fetal Wellbeing:  Cat 1  Pain Control:  Epidural  Anticipated MOD:  SVD  Lowry Ram, MD  PGY-1, [facility]  10/09/2022, 7:10 PM

## 2022-10-09 NOTE — Lactation Note (Signed)
This note was copied from a baby's chart. Lactation Consultation Note  Patient Name: Kathy Salazar [id number] Date: 10/09/2022   Age:31 hours Mom chooses to formula feed.  Maternal Data    Feeding    LATCH Score                    Lactation Tools Discussed/Used    Interventions    Discharge    Consult Status Consult Status: Complete    Kathy Salazar 10/09/2022, 9:19 PM

## 2022-10-09 NOTE — Anesthesia Preprocedure Evaluation (Signed)
Anesthesia Evaluation  Patient identified by MRN, date of birth, ID band Patient awake    Reviewed: Allergy & Precautions, H&P , NPO status , Patient's Chart, lab work & pertinent test results, reviewed documented beta blocker date and time   Airway Mallampati: I  TM Distance: >3 FB Neck ROM: full    Dental no notable dental hx. (+) Teeth Intact   Pulmonary neg pulmonary ROS,    Pulmonary exam normal breath sounds clear to auscultation       Cardiovascular negative cardio ROS Normal cardiovascular exam Rhythm:regular Rate:Normal     Neuro/Psych  Neuromuscular disease negative psych ROS   GI/Hepatic negative GI ROS, Neg liver ROS,   Endo/Other  negative endocrine ROS  Renal/GU negative Renal ROS  negative genitourinary   Musculoskeletal   Abdominal   Peds  Hematology  (+) Blood dyscrasia, anemia ,   Anesthesia Other Findings   Reproductive/Obstetrics (+) Pregnancy                             Anesthesia Physical Anesthesia Plan  ASA: 2  Anesthesia Plan: Epidural   Post-op Pain Management: Minimal or no pain anticipated   Induction: Intravenous  PONV Risk Score and Plan: Treatment may vary due to age or medical condition  Airway Management Planned: Natural Airway  Additional Equipment: None  Intra-op Plan:   Post-operative Plan:   Informed Consent: I have reviewed the patients History and Physical, chart, labs and discussed the procedure including the risks, benefits and alternatives for the proposed anesthesia with the patient or authorized representative who has indicated his/her understanding and acceptance.       Plan Discussed with: Anesthesiologist  Anesthesia Plan Comments:         Anesthesia Quick Evaluation

## 2022-10-09 NOTE — Plan of Care (Signed)

## 2022-10-10 LAB — CBC
HCT: 34.4 % — ABNORMAL LOW (ref 36.0–46.0)
Hemoglobin: 11.2 g/dL — ABNORMAL LOW (ref 12.0–15.0)
MCH: 28.6 pg (ref 26.0–34.0)
MCHC: 32.6 g/dL (ref 30.0–36.0)
MCV: 87.8 fL (ref 80.0–100.0)
Platelets: 120 10*3/uL — ABNORMAL LOW (ref 150–400)
RBC: 3.92 MIL/uL (ref 3.87–5.11)
RDW: 15.5 % (ref 11.5–15.5)
WBC: 9 10*3/uL (ref 4.0–10.5)
nRBC: 0 % (ref 0.0–0.2)

## 2022-10-10 NOTE — Anesthesia Postprocedure Evaluation (Signed)
Anesthesia Post Note  Patient: Kathy Salazar  Procedure(s) Performed: AN AD [place]     Patient location during evaluation: Mother Baby Anesthesia Type: Epidural Level of consciousness: awake and alert and oriented Pain management: satisfactory to patient Vital Signs Assessment: post-procedure vital signs reviewed and stable Respiratory status: respiratory function stable Cardiovascular status: stable Postop Assessment: no headache, no backache, epidural receding, patient able to bend at knees, no signs of nausea or vomiting, adequate PO intake and able to ambulate Anesthetic complications: no   No notable events documented.  Last Vitals:  Vitals:   10/10/22 0314 10/10/22 0856  BP: 97/71 97/69  Pulse: 71 72  Resp: 18 17  Temp: 36.7 C 36.8 C  SpO2:      Last Pain:  Vitals:   10/10/22 0856  TempSrc: Oral  PainSc:    Pain Goal:                   Samyiah Halvorsen

## 2022-10-10 NOTE — Progress Notes (Addendum)
Post Partum Day 1 Subjective: no complaints, up ad lib, voiding, and tolerating PO  Objective: Blood pressure 97/71, pulse 71, temperature 98.1 F (36.7 C), temperature source Oral, resp. rate 18, height 5\' 3"  (1.6 m), weight 74.9 kg, last menstrual period 05/11/2021, SpO2 98 %, unknown if currently breastfeeding.  Physical Exam:  General: alert, cooperative, and no distress Lochia: appropriate Uterine Fundus: firm Incision: n/a DVT Evaluation: No evidence of DVT seen on physical exam.  Recent Labs    10/09/22 2105 10/10/22 0523  HGB 11.7* 11.2*  HCT 34.8* 34.4*    Assessment/Plan: Plan for discharge tomorrow Bottle feeding Outpatient nexplanon   LOS: 1 day   Hansel Feinstein, CNM 10/10/2022, 6:32 AM

## 2022-10-11 ENCOUNTER — Other Ambulatory Visit (HOSPITAL_COMMUNITY): Payer: Self-pay

## 2022-10-11 MED ORDER — IBUPROFEN 600 MG PO TABS
600.0000 mg | ORAL_TABLET | Freq: Four times a day (QID) | ORAL | 0 refills | Status: DC
  Filled 2022-10-11: qty 30, 8d supply, fill #0

## 2022-10-11 NOTE — Discharge Summary (Addendum)
Postpartum Discharge Summary      Patient Name: Kathy Salazar DOB: [birth year] MRN: [id number]  Date of admission: 10/09/2022 Delivery date:10/09/2022  Delivering provider: Laurey Arrow [place]  Date of discharge: 10/11/2022  Admitting diagnosis: Post term pregnancy over 40 weeks [O48.0] Intrauterine pregnancy: [redacted]w[redacted]d    Secondary diagnosis:  Principal Problem:   Post term pregnancy over 40 weeks Active Problems:   Gestational thrombocytopenia ([facility]   Anemia in pregnancy   Positive GBS test   Vaginal delivery  Additional problems: none    Discharge diagnosis: Term Pregnancy Delivered and Gestational Thrombocytopenia                                               Post partum procedures: none Augmentation: AROM and Cytotec Complications: None  Hospital course: Induction of Labor With Vaginal Delivery   31y.o. yo [phone] [id number] admitted to the hospital 10/09/2022 for induction of labor.  Indication for induction:  Gestational Thrombocytopenia .  Patient had an labor course complicated bynothing Membrane Rupture Time/Date: 6:48 PM ,10/09/2022   Delivery Method:Vaginal, Spontaneous  Episiotomy: None  Lacerations:  2nd degree  Details of delivery can be found in separate delivery note.  Patient had a postpartum course complicated bynothing. Patient is discharged home 10/11/22.  Newborn Data: Birth date:10/09/2022  Birth time:7:55 PM  Gender:Female  Living status:Living  Apgars:9 ,9  Weight:3320 g   Magnesium Sulfate received: No BMZ received: No Rhophylac:N/A MMR:N/A T-DaP:Given prenatally Flu: N/A Transfusion:No  Physical exam  Vitals:   10/10/22 0314 10/10/22 0856 10/10/22 1307 10/11/22 0011  BP: 97/71 97/69 103/70 115/73  Pulse: 71 72 74 78  Resp: _0 Temp: 98.1 F (36.7 C) 98.3 F (36.8 C) 98.3 F (36.8 C) 98.1 F (36.7 C)  TempSrc: Oral Oral Oral Oral  SpO2:      Weight:      Height:       General: alert, cooperative, and no  distress Lochia: appropriate Uterine Fundus: firm Incision: none DVT Evaluation: No evidence of DVT seen on physical exam. Labs: Lab Results  Component Value Date   WBC 9.0 10/10/2022   HGB 11.2 (L) 10/10/2022   HCT 34.4 (L) 10/10/2022   MCV 87.8 10/10/2022   PLT 120 (L) 10/10/2022      Latest Ref Rng & Units 03/23/2019    4:53 PM  CMP  Glucose 65 - 99 mg/dL 90   BUN 6 - 20 mg/dL 8   Creatinine 0.57 - 1.00 mg/dL 0.64   Sodium 134 - 144 mmol/L 140   Potassium 3.5 - 5.2 mmol/L 4.4   Chloride 96 - 106 mmol/L 106   CO2 20 - 29 mmol/L 22   Calcium 8.7 - 10.2 mg/dL 9.7    Edinburgh Score:    10/11/2022    6:53 AM  Edinburgh Postnatal Depression Scale Screening Tool  I have been able to laugh and see the funny side of things. 0  I have looked forward with enjoyment to things. 0  I have blamed myself unnecessarily when things went wrong. 0  I have been anxious or worried for no good reason. 0  I have felt scared or panicky for no good reason. 0  Things have been getting on top of me. 0  I have been so unhappy that I have had  difficulty sleeping. 0  I have felt sad or miserable. 0  I have been so unhappy that I have been crying. 0  The thought of harming myself has occurred to me. 0  Edinburgh Postnatal Depression Scale Total 0      After visit meds:  Allergies as of 10/11/2022   No Known Allergies      Medication List     STOP taking these medications    ferrous sulfate 325 (65 FE) MG tablet       TAKE these medications    ibuprofen 600 MG tablet Commonly known as: ADVIL Take 1 tablet (600 mg total) by mouth every 6 (six) hours.   multivitamin-prenatal 27-0.8 MG Tabs tablet Take 1 tablet by mouth daily at 12 noon.         Discharge home in stable condition Infant Feeding: Breast Infant Disposition:home with mother Discharge instruction: per After Visit Summary and Postpartum booklet. Activity: Advance as tolerated. Pelvic rest for 6 weeks.  Diet:  routine diet Anticipated Birth Control: Nexplanon Postpartum Appointment:4 weeks Additional Postpartum F/U:  none Future Appointments:No future appointments. Follow up Visit:  [facility] Follow up.   Specialty: Family Medicine Contact information: [address] [id number] Danice G[place] [address] [phone]                    10/11/2022 Hansel Feinstein, CNM

## 2022-10-18 ENCOUNTER — Telehealth (HOSPITAL_COMMUNITY): Payer: Self-pay

## 2022-10-18 NOTE — Telephone Encounter (Signed)
Patient did not answer phone call. Voicemail left for patient.   Kathy Salazar [facility] 10/18/2022,1845

## 2023-09-06 ENCOUNTER — Encounter (HOSPITAL_COMMUNITY): Payer: Self-pay

## 2023-09-06 ENCOUNTER — Ambulatory Visit (HOSPITAL_COMMUNITY)
Admission: EM | Admit: 2023-09-06 | Discharge: 2023-09-06 | Disposition: A | Discharge status: Home / Self Care | Payer: Medicaid Other | Attending: Emergency Medicine | Admitting: Emergency Medicine

## 2023-09-06 DIAGNOSIS — N3000 Acute cystitis without hematuria: Secondary | ICD-10-CM | POA: Insufficient documentation

## 2023-09-06 LAB — POCT URINE PREGNANCY: Preg Test, Ur: NEGATIVE

## 2023-09-06 LAB — POCT URINALYSIS DIP (MANUAL ENTRY)
Bilirubin, UA: NEGATIVE
Glucose, UA: NEGATIVE mg/dL
Ketones, POC UA: NEGATIVE mg/dL
Nitrite, UA: NEGATIVE
Protein Ur, POC: NEGATIVE mg/dL
Spec Grav, UA: 1.01 (ref 1.010–1.025)
Urobilinogen, UA: 0.2 U/dL
pH, UA: 6.5 (ref 5.0–8.0)

## 2023-09-06 MED ORDER — SULFAMETHOXAZOLE-TRIMETHOPRIM 800-160 MG PO TABS: 1.0000 | ORAL_TABLET | Freq: Two times a day (BID) | ORAL | 0 refills | Status: AC

## 2023-09-06 MED ORDER — PHENAZOPYRIDINE HCL 95 MG PO TABS: 95.0000 mg | ORAL_TABLET | Freq: Three times a day (TID) | ORAL | 0 refills | Status: DC | PRN

## 2023-09-06 NOTE — ED Provider Notes (Addendum)
[facility]    CSN: [id number] Arrival date & time: 09/06/23  1644      History   Chief Complaint Chief Complaint  Patient presents with   Abdominal Pain    X1 day Abdominal pain and urinary frequency    HPI Kathy Salazar is a 32 y.o. female.   Patient presents to clinic for suprapubic discomfort, dysuria, and frequency that started last night.  Thinks this may have been triggered by caffeine, started shortly after she had a sugary drink from [employer], and got worse when she had coffee this morning..   She denies any nausea, vomiting, fevers or flank pain.  No changes to vaginal discharge.  She does have a history of frequent UTIs per patient.  She has been doing warm compresses to her suprapubic area and taking hot and cold showers.  She did take Tylenol today for the pain.    The history is provided by the patient and medical records.  Abdominal Pain Associated symptoms: dysuria   Associated symptoms: no diarrhea, no nausea, no vaginal bleeding, no vaginal discharge and no vomiting     Past Medical History:  Diagnosis Date   No pertinent past medical history     Patient Active Problem List   Diagnosis Date Noted   Vaginal delivery 10/11/2022   Post term pregnancy over 40 weeks 10/09/2022   Positive GBS test 09/11/2022   Shingles 08/28/2022   Headache 08/28/2022   Anemia in pregnancy 08/09/2022   Abnormal glucose tolerance test during pregnancy, not yet delivered 06/28/2022   Supervision of low-risk pregnancy 03/07/2022   Carpal tunnel syndrome 01/13/2020   Palpitations with regular cardiac rhythm 01/13/2020   PCOS (polycystic ovarian syndrome) 01/12/2020   Gestational thrombocytopenia (HCC) 05/22/2016    Past Surgical History:  Procedure Laterality Date   APPENDECTOMY      OB History     Gravida  4   Para  3   Term  3   Preterm  0   AB  1   Living  3      SAB  1   IAB  0   Ectopic  0   Multiple  0   Live Births   3            Home Medications    Prior to Admission medications   Medication Sig Start Date End Date Taking? Authorizing Provider  phenazopyridine (PYRIDIUM) 95 MG tablet Take 1 tablet (95 mg total) by mouth 3 (three) times daily as needed for pain. 09/06/23  Yes Rinaldo Ratel, [place] N, FNP  sulfamethoxazole-trimethoprim (BACTRIM DS) 800-160 MG tablet Take 1 tablet by mouth 2 (two) times daily for 3 days. 09/06/23 09/09/23 Yes Rinaldo Ratel, [place] N, FNP  ibuprofen (ADVIL) 600 MG tablet Take 1 tablet (600 mg total) by mouth every 6 (six) hours. 10/11/22   Aviva Signs, CNM  Prenatal Vit-Fe Fumarate-FA (MULTIVITAMIN-PRENATAL) 27-0.8 MG TABS tablet Take 1 tablet by mouth daily at 12 noon.    [provider]    Family History Family History  Problem Relation Age of Onset   Miscarriages / Stillbirths Sister    Hyperlipidemia Sister     Social History Social History   Tobacco Use   Smoking status: Never   Smokeless tobacco: Never  Vaping Use   Vaping status: Never Used  Substance Use Topics   Alcohol use: Not Currently    Comment: occasionally   Drug use: No     Allergies  Patient has no known allergies.   Review of Systems Review of Systems  Gastrointestinal:  Positive for abdominal pain. Negative for diarrhea, nausea and vomiting.  Genitourinary:  Positive for dysuria, frequency and urgency. Negative for flank pain, menstrual problem, vaginal bleeding and vaginal discharge.     Physical Exam Triage Vital Signs ED Triage Vitals  Encounter Vitals Group     BP 09/06/23 1739 118/80     Systolic BP Percentile --      Diastolic BP Percentile --      Pulse Rate 09/06/23 1739 87     Resp 09/06/23 1739 17     Temp 09/06/23 1739 98.2 F (36.8 C)     Temp Source 09/06/23 1739 Oral     SpO2 09/06/23 1739 95 %     Weight 09/06/23 1738 130 lb (59 kg)     Height 09/06/23 1738 5\' 3"  (1.6 m)     Head Circumference --      Peak Flow --      Pain Score 09/06/23  1738 0     Pain Loc --      Pain Education --      Exclude from Growth Chart --    No data found.  Updated Vital Signs BP 118/80 (BP Location: Left Arm)   Pulse 87   Temp 98.2 F (36.8 C) (Oral)   Resp 17   Ht 5\' 3"  (1.6 m)   Wt 130 lb (59 kg)   LMP 09/02/2023   SpO2 95%   BMI 23.03 kg/m   Visual Acuity Right Eye Distance:   Left Eye Distance:   Bilateral Distance:    Right Eye Near:   Left Eye Near:    Bilateral Near:     Physical Exam Vitals and nursing note reviewed.  Constitutional:      Appearance: Normal appearance. She is well-developed.  HENT:     Head: Normocephalic and atraumatic.     Right Ear: External ear normal.     Left Ear: External ear normal.     Nose: Nose normal.     Mouth/Throat:     Mouth: Mucous membranes are moist.  Cardiovascular:     Rate and Rhythm: Normal rate.  Pulmonary:     Effort: Pulmonary effort is normal. No respiratory distress.  Abdominal:     Tenderness: There is no abdominal tenderness. There is no right CVA tenderness or left CVA tenderness.  Musculoskeletal:        General: Normal range of motion.  Skin:    General: Skin is warm and dry.  Neurological:     General: No focal deficit present.     Mental Status: She is alert and oriented to person, place, and time.  Psychiatric:        Mood and Affect: Mood normal.        Behavior: Behavior normal.      UC Treatments / Results  Labs (all labs ordered are listed, but only abnormal results are displayed) Labs Reviewed  URINE CULTURE  POCT URINALYSIS DIP (MANUAL ENTRY)  POCT URINE PREGNANCY    EKG   Radiology No results found.  Procedures Procedures (including critical care time)  Medications Ordered in UC Medications - No data to display  Initial Impression / Assessment and Plan / UC Course  I have reviewed the triage vital signs and the nursing notes.  Pertinent labs & imaging results that were available during my care of the patient were reviewed  by  me and considered in my medical decision making (see chart for details).  Vitals and triage reviewed, patient is hemodynamically stable.  Suprapubic discomfort, frequency, urgency and dysuria.  Negative for CVA tenderness, afebrile and without nausea or vomiting, low concern for pyelonephritis.  Urinalysis with blood and leukocytes, negative for nitrites.  Negative urine pregnancy.  Will treat with Bactrim and send urine for culture. Symptomatic management discussed. POC, f/u care and return precautions given, no questions at this time.      Final Clinical Impressions(s) / UC Diagnoses   Final diagnoses:  Acute cystitis without hematuria     Discharge Instructions      Your urine showed signs of urinary tract infection, please take all antibiotics as prescribed and until finished. Take them with food to help prevent gastrointestinal upset.  You can also take AZO as needed for dysuria, this will change the color of your secretions. To help prevent urinary tract infections in the past ensure you are limiting urinary irritants such as sodas and juices and caffeine.  You can also try an over-the-counter supplement to help prevent UTIs, called d-mannose.  We have sent your urine off for culture and we will contact you if your antibiotic treatment needs to be modified.    Return to clinic for any new or urgent symptoms.      ED Prescriptions     Medication Sig Dispense Auth. Provider   sulfamethoxazole-trimethoprim (BACTRIM DS) 800-160 MG tablet Take 1 tablet by mouth 2 (two) times daily for 3 days. 6 tablet Rinaldo Ratel, [place] N, FNP   phenazopyridine (PYRIDIUM) 95 MG tablet Take 1 tablet (95 mg total) by mouth 3 (three) times daily as needed for pain. 10 tablet Rayshon Albaugh, [place] N, FNP      PDMP not reviewed this encounter.       Lavi Sheehan, [place] N, [place] 09/06/23 [phone]

## 2023-09-06 NOTE — ED Triage Notes (Signed)
Pt states that she has abdominal pain and urinary frequency. X1 day

## 2023-09-06 NOTE — Discharge Instructions (Addendum)
Your urine showed signs of urinary tract infection, please take all antibiotics as prescribed and until finished. Take them with food to help prevent gastrointestinal upset.  You can also take AZO as needed for dysuria, this will change the color of your secretions. To help prevent urinary tract infections in the past ensure you are limiting urinary irritants such as sodas and juices and caffeine.  You can also try an over-the-counter supplement to help prevent UTIs, called d-mannose.  We have sent your urine off for culture and we will contact you if your antibiotic treatment needs to be modified.    Return to clinic for any new or urgent symptoms.

## 2023-09-08 LAB — URINE CULTURE: Culture: NO GROWTH

## 2023-10-28 ENCOUNTER — Ambulatory Visit (INDEPENDENT_AMBULATORY_CARE_PROVIDER_SITE_OTHER): Payer: Self-pay | Admitting: Student

## 2023-10-28 DIAGNOSIS — R300 Dysuria: Secondary | ICD-10-CM

## 2023-10-28 DIAGNOSIS — N760 Acute vaginitis: Secondary | ICD-10-CM

## 2023-10-28 DIAGNOSIS — N898 Other specified noninflammatory disorders of vagina: Secondary | ICD-10-CM

## 2023-10-28 DIAGNOSIS — B379 Candidiasis, unspecified: Secondary | ICD-10-CM

## 2023-10-28 DIAGNOSIS — B9689 Other specified bacterial agents as the cause of diseases classified elsewhere: Secondary | ICD-10-CM

## 2023-10-28 LAB — POCT URINALYSIS DIP (MANUAL ENTRY)
Bilirubin, UA: NEGATIVE
Blood, UA: NEGATIVE
Glucose, UA: NEGATIVE mg/dL
Ketones, POC UA: NEGATIVE mg/dL
Leukocytes, UA: NEGATIVE
Nitrite, UA: NEGATIVE
Protein Ur, POC: NEGATIVE mg/dL
Spec Grav, UA: 1.01 (ref 1.010–1.025)
Urobilinogen, UA: 0.2 U/dL
pH, UA: 7 (ref 5.0–8.0)

## 2023-10-28 LAB — POCT WET PREP (WET MOUNT)
Clue Cells Wet Prep Whiff POC: POSITIVE
Trichomonas Wet Prep HPF POC: ABSENT
WBC, Wet Prep HPF POC: >20

## 2023-10-28 MED ORDER — METRONIDAZOLE 500 MG PO TABS: 500.0000 mg | ORAL_TABLET | Freq: Two times a day (BID) | ORAL | 0 refills | Status: DC

## 2023-10-28 MED ORDER — FLUCONAZOLE 150 MG PO TABS: 150.0000 mg | ORAL_TABLET | Freq: Once | ORAL | 0 refills | Status: AC

## 2023-10-28 NOTE — Progress Notes (Unsigned)
    SUBJECTIVE:   CHIEF COMPLAINT / HPI:   Vaginal odor Since Saturday has noticed fishy smell, increased discharge and irritation with urinating. Denies increased frequency or burning.  Not on birth control but had period 2 weeks ago and does use condoms.     PERTINENT  PMH / PSH: ***  OBJECTIVE:   There were no vitals taken for this visit. ***  General: NAD, pleasant, able to participate in exam Cardiac: RRR, no murmurs. Respiratory: CTAB, normal effort, No wheezes, rales or rhonchi Abdomen: Bowel sounds present, nontender, nondistended, no hepatosplenomegaly. Extremities: no edema or cyanosis. Skin: warm and dry, no rashes noted Neuro: alert, no obvious focal deficits Psych: Normal affect and mood  ASSESSMENT/PLAN:   No problem-specific Assessment & Plan notes found for this encounter.     Dr. Erick Alley, DO [facility]    {    This will disappear when note is signed, click to select method of visit    :1}

## 2023-10-28 NOTE — Patient Instructions (Addendum)
It was great to see you! Thank you for allowing me to participate in your care!  I recommend that you always bring your medications to each appointment as this makes it easy to ensure you are on the correct medications and helps [place] not miss when refills are needed.  Our plans for today:  - You tested positive for BV and yeast -For BV -take metronidazole 1 tablet twice a day for 7 days -For yeast-take 1 tablet Diflucan on day 1 and repeat the dose in 3 days -Avoid using scented soaps and I recommend washing only with warm water -If still having symptoms within a week, please return   Take care and seek immediate care sooner if you develop any concerns.   Dr. Erick Alley, DO [facility]

## 2023-10-30 DIAGNOSIS — N760 Acute vaginitis: Secondary | ICD-10-CM | POA: Insufficient documentation

## 2023-10-30 DIAGNOSIS — B379 Candidiasis, unspecified: Secondary | ICD-10-CM | POA: Insufficient documentation

## 2023-10-30 NOTE — Assessment & Plan Note (Signed)
Yeast on wet prep. -Diflucan 50 mg 1 tablet, repeat in 3 days as she is also taking metronidazole

## 2023-10-30 NOTE — Assessment & Plan Note (Addendum)
UA negative for UTI.  Wet prep positive for BV -Rx metronidazole 500 mg twice daily for 7 days

## 2023-11-08 ENCOUNTER — Ambulatory Visit (HOSPITAL_COMMUNITY)
Admission: EM | Admit: 2023-11-08 | Discharge: 2023-11-08 | Disposition: A | Discharge status: Home / Self Care | Payer: Self-pay | Attending: Emergency Medicine | Admitting: Emergency Medicine

## 2023-11-08 ENCOUNTER — Ambulatory Visit (INDEPENDENT_AMBULATORY_CARE_PROVIDER_SITE_OTHER): Payer: Self-pay

## 2023-11-08 ENCOUNTER — Encounter (HOSPITAL_COMMUNITY): Payer: Self-pay | Admitting: *Deleted

## 2023-11-08 DIAGNOSIS — M62838 Other muscle spasm: Secondary | ICD-10-CM

## 2023-11-08 MED ORDER — BACLOFEN 10 MG PO TABS: 10.0000 mg | ORAL_TABLET | Freq: Every day | ORAL | 0 refills | Status: AC

## 2023-11-08 MED ORDER — IBUPROFEN 600 MG PO TABS: 600.0000 mg | ORAL_TABLET | Freq: Three times a day (TID) | ORAL | 0 refills | Status: AC | PRN

## 2023-11-08 NOTE — Discharge Instructions (Addendum)
The x-ray of your neck did not reveal any signs of misalignment, injury or arthritis.  I have printed a copy of the report for your convenience.  The mainstay of therapy for musculoskeletal pain is reduction of inflammation and relaxation of tension which is causing inflammation.  Keep in mind, pain always begets more pain.  To help you stay ahead of your pain and inflammation, I have provided the following regimen for you:   This evening, please begin taking baclofen 10 mg.  This is a highly effective muscle relaxer and antispasmodic which should continue to provide you with relaxation of your tense muscles, allow you to sleep well and to keep your pain under control.   When you pick up your prescription from the pharmacy, please begin taking ibuprofen 600 mg 3 times daily.  Please keep in mind that it is always easier to treat a little bit of pain that is to treat a lot of pain.  I recommend that for the next several days, you take this medication on a scheduled basis.  After that, take it when you begin to feel the pain returning, do not wait until you are in a lot of pain.   During the day, please set aside time to apply ice to the affected area 4 times daily for 20 minutes each application.  This can be achieved by using a bag of frozen peas or corn, a Ziploc bag filled with ice and water, or Ziploc bag filled with half rubbing alcohol and half Dawn dish detergent, frozen into a slush.  Please be careful not to apply ice directly to your skin, always place a soft cloth between you and the ice pack.  Over-the-counter products such as IcyHot and Biofreeze do not work nearly as well.   Please avoid attempts to stretch or strengthen the affected area until you are feeling completely pain-free.  Attempts to do so will only prolong the healing process.   Thank you for visiting [facility] today.  We appreciate the opportunity to participate in your care.

## 2023-11-08 NOTE — ED Provider Notes (Signed)
[facility]    CSN: [id number] Arrival date & time: 11/08/23  1044    HISTORY   Chief Complaint  Patient presents with   Neck Pain   HPI Kathy Salazar is a pleasant, 32 y.o. female who presents to urgent care today. Pt states that she is having left sided neck pain that radiates down her left arm x 5 days. States she has had this before but it has never lasted this long. States she has been taking IBU intermittently which helps some.  States has never sought medical attn for this in the past.  Denies known neck injury, trauma to neck.  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  The history is provided by the patient.   Past Medical History:  Diagnosis Date   No pertinent past medical history    Patient Active Problem List   Diagnosis Date Noted   Bacterial vaginosis 10/30/2023   Yeast infection 10/30/2023   Vaginal delivery 10/11/2022   Post term pregnancy over 40 weeks 10/09/2022   Positive GBS test 09/11/2022   Shingles 08/28/2022   Headache 08/28/2022   Anemia in pregnancy 08/09/2022   Abnormal glucose tolerance test during pregnancy, not yet delivered 06/28/2022   Supervision of low-risk pregnancy 03/07/2022   Carpal tunnel syndrome 01/13/2020   Palpitations with regular cardiac rhythm 01/13/2020   PCOS (polycystic ovarian syndrome) 01/12/2020   Gestational thrombocytopenia (HCC) 05/22/2016   Past Surgical History:  Procedure Laterality Date   APPENDECTOMY     OB History     Gravida  4   Para  3   Term  3   Preterm  0   AB  1   Living  3      SAB  1   IAB  0   Ectopic  0   Multiple  0   Live Births  3          Home Medications    Prior to Admission medications   Medication Sig Start Date End Date Taking? Authorizing Provider  ibuprofen (ADVIL) 600 MG tablet Take 1 tablet (600 mg total) by mouth every 6 (six) hours. 10/11/22  Yes  Aviva Signs, CNM  metroNIDAZOLE (FLAGYL) 500 MG tablet Take 1 tablet (500 mg total) by mouth 2 (two) times daily. 10/28/23   Erick Alley, DO  phenazopyridine (PYRIDIUM) 95 MG tablet Take 1 tablet (95 mg total) by mouth 3 (three) times daily as needed for pain. 09/06/23   Garrison, [place] N, FNP  Prenatal Vit-Fe Fumarate-FA (MULTIVITAMIN-PRENATAL) 27-0.8 MG TABS tablet Take 1 tablet by mouth daily at 12 noon.    [provider]    Family History Family History  Problem Relation Age of Onset   Miscarriages / Stillbirths Sister    Hyperlipidemia Sister    Social History Social History   Tobacco Use   Smoking status: Never   Smokeless tobacco: Never  Vaping Use   Vaping status: Never Used  Substance Use Topics   Alcohol use: Not Currently    Comment: occasionally   Drug use: No   Allergies   Patient has no known allergies.  Review of Systems Review of Systems Pertinent findings revealed after performing a 14 point review of systems has been noted in the history of present illness.  Physical Exam Vital Signs BP 116/80 (BP Location: Left Arm)   Pulse 77   Temp 98.3 F (36.8  C) (Oral)   Resp 16   LMP 10/08/2023 (Approximate)   SpO2 96%   No data found.  Physical Exam  [facility] / Diagnostics / Procedures:     Radiology DG Cervical Spine Complete  Result Date: 11/08/2023 CLINICAL DATA:  Recurrent left-sided neck pain and radiculopathy for 5 days. No history of trauma reported. EXAM: CERVICAL SPINE - COMPLETE 4 VIEW COMPARISON:  None Available. FINDINGS: There is no evidence of cervical spine fracture or prevertebral soft tissue swelling. Alignment is normal. No other significant bone abnormalities are identified. No osseous neural foraminal encroachment on the oblique views. Overall if there is further concern of radiculopathy additional workup MRI could be considered as clinically appropriate. IMPRESSION: No acute osseous abnormality Electronically Signed    By: Karen Kays M.D.   On: 11/08/2023 13:20    Procedures Procedures (including critical care time) EKG  Pending results:  Labs Reviewed - No data to display  Medications Ordered in UC: Medications - No data to display  UC Diagnoses / Final Clinical Impressions(s)   I have reviewed the triage vital signs and the nursing notes.  Pertinent labs & imaging results that were available during my care of the patient were reviewed by me and considered in my medical decision making (see chart for details).    Final diagnoses:  Cervical paraspinous muscle spasm    Patient advised of x-ray findings. Patient was advised to: Take Ibuprofen 600 mg 3 times daily for the next 5 to 7 days Take muscle relaxer once daily 1 hour prior to bedtime Apply ice pack to affected area 4 times daily for 20 minutes each time Avoid stretching or strengthening exercises until pain is completely resolved Return precautions advised  Please see discharge instructions below for details of plan of care as provided to patient. ED Prescriptions     Medication Sig Dispense Auth. Provider   ibuprofen (ADVIL) 600 MG tablet Take 1 tablet (600 mg total) by mouth every 8 (eight) hours as needed for up to 30 doses for fever, headache, mild pain (pain score 1-3) or moderate pain (pain score 4-6) (Inflammation). Take 1 tablet 3 times daily as needed for inflammation of upper airways and/or pain. 30 tablet Theadora Rama Scales, PA-C   baclofen (LIORESAL) 10 MG tablet Take 1 tablet (10 mg total) by mouth at bedtime for 7 days. 7 tablet Theadora Rama Scales, PA-C      PDMP not reviewed this encounter.  Discharge Instructions:   Discharge Instructions      The x-ray of your neck did not reveal any signs of misalignment, injury or arthritis.  I have printed a copy of the report for your convenience.  The mainstay of therapy for musculoskeletal pain is reduction of inflammation and relaxation of tension which is  causing inflammation.  Keep in mind, pain always begets more pain.  To help you stay ahead of your pain and inflammation, I have provided the following regimen for you:   This evening, please begin taking baclofen 10 mg.  This is a highly effective muscle relaxer and antispasmodic which should continue to provide you with relaxation of your tense muscles, allow you to sleep well and to keep your pain under control.   When you pick up your prescription from the pharmacy, please begin taking ibuprofen 600 mg 3 times daily.  Please keep in mind that it is always easier to treat a little bit of pain that is to treat a lot of pain.  I  recommend that for the next several days, you take this medication on a scheduled basis.  After that, take it when you begin to feel the pain returning, do not wait until you are in a lot of pain.   During the day, please set aside time to apply ice to the affected area 4 times daily for 20 minutes each application.  This can be achieved by using a bag of frozen peas or corn, a Ziploc bag filled with ice and water, or Ziploc bag filled with half rubbing alcohol and half Dawn dish detergent, frozen into a slush.  Please be careful not to apply ice directly to your skin, always place a soft cloth between you and the ice pack.  Over-the-counter products such as IcyHot and Biofreeze do not work nearly as well.   Please avoid attempts to stretch or strengthen the affected area until you are feeling completely pain-free.  Attempts to do so will only prolong the healing process.   Thank you for visiting [facility] today.  We appreciate the opportunity to participate in your care.       Disposition Upon Discharge:  Condition: stable for discharge home Home: take medications as prescribed; routine discharge instructions as discussed; follow up as advised.  Patient presented with an acute illness with associated systemic symptoms and significant discomfort requiring  urgent management. In my opinion, this is a condition that a prudent lay person (someone who possesses an average knowledge of health and medicine) may potentially expect to result in complications if not addressed urgently such as respiratory distress, impairment of bodily function or dysfunction of bodily organs.   Routine symptom specific, illness specific and/or disease specific instructions were discussed with the patient and/or caregiver at length.   As such, the patient has been evaluated and assessed, work-up was performed and treatment was provided in alignment with urgent care protocols and evidence based medicine.  Patient/parent/caregiver has been advised that the patient may require follow up for further testing and treatment if the symptoms continue in spite of treatment, as clinically indicated and appropriate.  Patient/parent/caregiver has been advised to report to orthopedic urgent care clinic or return to the [facility] or PCP in 3-5 days if no better; follow-up with orthopedics, PCP or the Emergency Department if new signs and symptoms develop or if the current signs or symptoms continue to change or worsen for further workup, evaluation and treatment as clinically indicated and appropriate  The patient will follow up with their current PCP if and as advised. If the patient does not currently have a PCP we will have assisted them in obtaining one.   The patient may need specialty follow up if the symptoms continue, in spite of conservative treatment and management, for further workup, evaluation, consultation and treatment as clinically indicated and appropriate.  Patient/parent/caregiver verbalized understanding and agreement of plan as discussed.  All questions were addressed during visit.  Please see discharge instructions below for further details of plan.  This office note has been dictated using Teaching laboratory technician.  Unfortunately, this method of dictation can sometimes  lead to typographical or grammatical errors.  I apologize for your inconvenience in advance if this occurs.  Please do not hesitate to reach out to me if clarification is needed.      Theadora Rama Scales, PA-C 11/08/23 1327

## 2023-11-08 NOTE — ED Triage Notes (Signed)
Pt states that she is having left sided neck pain that radiates down her left arm x 5 days. She states she has had this before but it hasn't lasted this long. She has been taking IBU as needed.

## 2024-02-23 ENCOUNTER — Encounter (HOSPITAL_COMMUNITY): Payer: Self-pay

## 2024-02-23 ENCOUNTER — Ambulatory Visit (HOSPITAL_COMMUNITY)
Admission: EM | Admit: 2024-02-23 | Discharge: 2024-02-23 | Disposition: A | Discharge status: Home / Self Care | Payer: Self-pay

## 2024-02-23 DIAGNOSIS — N3001 Acute cystitis with hematuria: Secondary | ICD-10-CM | POA: Insufficient documentation

## 2024-02-23 LAB — POCT URINALYSIS DIP (MANUAL ENTRY)
Bilirubin, UA: NEGATIVE
Glucose, UA: NEGATIVE mg/dL
Ketones, POC UA: NEGATIVE mg/dL
Nitrite, UA: NEGATIVE
Protein Ur, POC: 100 mg/dL — AB
Spec Grav, UA: 1.025 (ref 1.010–1.025)
Urobilinogen, UA: 0.2 U/dL
pH, UA: 5.5 (ref 5.0–8.0)

## 2024-02-23 LAB — POCT URINE PREGNANCY: Preg Test, Ur: NEGATIVE

## 2024-02-23 MED ORDER — PHENAZOPYRIDINE HCL 200 MG PO TABS: 200.0000 mg | ORAL_TABLET | Freq: Three times a day (TID) | ORAL | 0 refills | Status: AC

## 2024-02-23 MED ORDER — CEPHALEXIN 500 MG PO CAPS: 500.0000 mg | ORAL_CAPSULE | Freq: Three times a day (TID) | ORAL | 0 refills | Status: AC

## 2024-02-23 NOTE — Discharge Instructions (Addendum)
  1. Acute cystitis with hematuria (Primary) - POC urinalysis dipstick completed in UC shows small leukocytes, large blood, no nitrite, these findings are suggestive of urinary tract infection -Urine culture sent to lab for further testing results should be available in 2 to 3 days. - POCT urine pregnancy performed in UC is negative for pregnancy - cephALEXin (KEFLEX) 500 MG capsule; Take 1 capsule (500 mg total) by mouth 3 (three) times daily for 7 days for UTI - phenazopyridine (PYRIDIUM) 200 MG tablet; Take 1 tablet (200 mg total) by mouth 3 (three) times daily for dysuria secondary to UTI -Continue to monitor symptoms for any change in severity if there is any escalation of symptoms follow-up for further evaluation and management.

## 2024-02-23 NOTE — ED Provider Notes (Signed)
 [facility]  Note:  This document was prepared using Dragon voice recognition software and may include unintentional dictation errors.  MRN: [id number] DOB: 12-28-1990  Subjective:   Kathy Salazar is a 33 y.o. female presenting for dysuria, increased urinary frequency and suprapubic abdominal pressure x 1 to 2 days.  Patient reports taking Tylenol with minimal improvement.  Patient reports past history of urinary tract infections with similar symptoms.  Denies fever, urinary hesitancy, vaginal discharge, vaginal lesion, flank pain.  No current facility-administered medications for this encounter.  Current Outpatient Medications:    cephALEXin (KEFLEX) 500 MG capsule, Take 1 capsule (500 mg total) by mouth 3 (three) times daily for 7 days., Disp: 21 capsule, Rfl: 0   phenazopyridine (PYRIDIUM) 200 MG tablet, Take 1 tablet (200 mg total) by mouth 3 (three) times daily., Disp: 6 tablet, Rfl: 0   ibuprofen (ADVIL) 600 MG tablet, Take 1 tablet (600 mg total) by mouth every 8 (eight) hours as needed for up to 30 doses for fever, headache, mild pain (pain score 1-3) or moderate pain (pain score 4-6) (Inflammation). Take 1 tablet 3 times daily as needed for inflammation of upper airways and/or pain., Disp: 30 tablet, Rfl: 0   No Known Allergies  Past Medical History:  Diagnosis Date   No pertinent past medical history      Past Surgical History:  Procedure Laterality Date   APPENDECTOMY      Family History  Problem Relation Age of Onset   Miscarriages / Stillbirths Sister    Hyperlipidemia Sister     Social History   Tobacco Use   Smoking status: Never   Smokeless tobacco: Never  Vaping Use   Vaping status: Never Used  Substance Use Topics   Alcohol use: Not Currently    Comment: occasionally   Drug use: No    ROS Refer to HPI for ROS details.  Objective:   Vitals: BP 125/77 (BP Location: Left Arm)   Pulse 74   Temp 98.4 F (36.9 C) (Oral)   Resp  16   LMP 01/14/2024   SpO2 97%   Physical Exam Vitals and nursing note reviewed.  Constitutional:      General: She is not in acute distress.    Appearance: Normal appearance. She is well-developed. She is not ill-appearing or toxic-appearing.  HENT:     Head: Normocephalic.  Cardiovascular:     Rate and Rhythm: Normal rate.  Pulmonary:     Effort: Pulmonary effort is normal. No respiratory distress.  Abdominal:     General: There is no distension.     Palpations: Abdomen is soft.     Tenderness: There is abdominal tenderness (Suprapubic abdominal pressure with palpation). There is no right CVA tenderness, left CVA tenderness, guarding or rebound.  Skin:    General: Skin is warm and dry.  Neurological:     General: No focal deficit present.     Mental Status: She is alert and oriented to person, place, and time.  Psychiatric:        Mood and Affect: Mood normal.     Procedures  Results for orders placed or performed during the hospital encounter of 02/23/24 (from the past 24 hours)  POC urinalysis dipstick     Status: Abnormal   Collection Time: 02/23/24  5:39 PM  Result Value Ref Range   Color, UA yellow yellow   Clarity, UA clear clear   Glucose, UA negative negative mg/dL   Bilirubin, UA negative  negative   Ketones, POC UA negative negative mg/dL   Spec Grav, UA 5.284 1.324 - 1.025   Blood, UA large (A) negative   pH, UA 5.5 5.0 - 8.0   Protein Ur, POC =100 (A) negative mg/dL   Urobilinogen, UA 0.2 0.2 or 1.0 E.U./dL   Nitrite, UA Negative Negative   Leukocytes, UA Small (1+) (A) Negative  POCT urine pregnancy     Status: None   Collection Time: 02/23/24  5:39 PM  Result Value Ref Range   Preg Test, Ur Negative Negative    Assessment and Plan :   PDMP not reviewed this encounter.  1. Acute cystitis with hematuria     1. Acute cystitis with hematuria (Primary) - POC urinalysis dipstick completed in UC shows small leukocytes, large blood, no nitrite,  these findings are suggestive of urinary tract infection -Urine culture sent to lab for further testing results should be available in 2 to 3 days. - POCT urine pregnancy performed in UC is negative for pregnancy - cephALEXin (KEFLEX) 500 MG capsule; Take 1 capsule (500 mg total) by mouth 3 (three) times daily for 7 days for UTI - phenazopyridine (PYRIDIUM) 200 MG tablet; Take 1 tablet (200 mg total) by mouth 3 (three) times daily for dysuria secondary to UTI -Continue to monitor symptoms for any change in severity if there is any escalation of symptoms follow-up for further evaluation and management.  Lucky Cowboy   [place], [place], [place] 02/23/24 1851

## 2024-02-23 NOTE — ED Triage Notes (Signed)
 Pt reports dysuria started yesterday. Denies and fever and vomiting. Patient is taking Tylenol.

## 2024-02-25 LAB — URINE CULTURE: Culture: >=100000 — AB

## 2024-05-05 ENCOUNTER — Other Ambulatory Visit (HOSPITAL_COMMUNITY)
Admission: RE | Admit: 2024-05-05 | Discharge: 2024-05-05 | Disposition: A | Discharge status: Home / Self Care | Payer: Self-pay | Source: Ambulatory Visit | Attending: Family Medicine | Admitting: Family Medicine

## 2024-05-05 ENCOUNTER — Ambulatory Visit (INDEPENDENT_AMBULATORY_CARE_PROVIDER_SITE_OTHER): Payer: Self-pay | Admitting: Family Medicine

## 2024-05-05 VITALS — BP 110/83 | HR 87 | Ht 63.0 in | Wt 130.2 lb

## 2024-05-05 DIAGNOSIS — N898 Other specified noninflammatory disorders of vagina: Secondary | ICD-10-CM | POA: Insufficient documentation

## 2024-05-05 LAB — POCT WET PREP (WET MOUNT)
Clue Cells Wet Prep Whiff POC: NEGATIVE
Trichomonas Wet Prep HPF POC: ABSENT

## 2024-05-05 MED ORDER — METRONIDAZOLE 0.75 % VA GEL: 1.0000 | Freq: Every day | VAGINAL | 0 refills | Status: DC

## 2024-05-05 NOTE — Progress Notes (Signed)
    SUBJECTIVE:   CHIEF COMPLAINT / HPI:   Vaginal discharge Started over the past weekend.  Reports she was at the beach and had sand and irritation in her pelvic area.  Also had intercourse with her husband.  Last menstrual cycle about 3 weeks ago.  Uses vasectomy for birth control.  Denies using any scented vaginal products or vaginal douching.  Has had BV multiple times, unclear why it keeps recurring.   PERTINENT  PMH / PSH: PCOS  OBJECTIVE:   BP 110/83   Pulse 87   Ht 5\' 3"  (1.6 m)   Wt 130 lb 3.2 oz (59.1 kg)   SpO2 100%   BMI 23.06 kg/m    General: NAD, pleasant, able to participate in exam Pelvic exam: normal external genitalia, vulva, vagina, cervix, uterus and adnexa, VAGINA: normal appearing vagina with normal color and discharge, no lesions, vaginal discharge - white, copious, and malodorous, CERVIX: normal appearing cervix without discharge or lesions, exam chaperoned by Ammon Bales, CMA.   ASSESSMENT/PLAN:   Assessment & Plan Vaginal discharge Wet prep negative but copious, malodorous white vaginal discharge upon exam most consistent with BV.  Possibly inadequate sample, will treat with MetroGel .  Extensively discussed prevention and will continue to monitor for recurrence. -MetroGel  nightly x 5 days -F/u G/C   Dr. Jonne Netters, DO [facility]

## 2024-05-05 NOTE — Patient Instructions (Signed)
 It was wonderful to see you today! Thank you for choosing [facility].   Please bring ALL of your medications with you to every visit.   Today we talked about:  The testing, did not indicate an infection but given your presentation with a lot of discharge with the smell I am going to go ahead and treat you with the MetroGel  that you do nightly for the next 5 nights.  As we talked about please try to avoid any fragrances or washing within your vagina.  As we discussed there is some very limited evidence that this could be transferred back and forth between your sexual partner so I recommend using protection or abstaining from intercourse while you are being treated to see if this can help.  If you have a recurrent infection in the next couple of months let me know and we can consider other options for your symptoms.  Please follow up as needed for recurrent symptoms  If you haven't already, sign up for My Chart to have easy access to your labs results, and communication with your primary care physician.   We are checking some labs today. If they are abnormal, I will call you. If they are normal, I will send you a MyChart message (if it is active) or a letter in the mail. If you do not hear about your labs in the next 2 weeks, please call the office.  Call the clinic at [phone] if your symptoms worsen or you have any concerns.  Please be sure to schedule follow up at the front desk before you leave today.   Jonne Netters, DO Family Medicine

## 2024-05-06 ENCOUNTER — Ambulatory Visit: Payer: Self-pay | Admitting: Family Medicine

## 2024-05-06 LAB — CERVICOVAGINAL ANCILLARY ONLY
Chlamydia: NEGATIVE
Comment: NEGATIVE
Comment: NORMAL
Neisseria Gonorrhea: NEGATIVE

## 2024-05-06 MED ORDER — CHLORHEXIDINE GLUCONATE 0.12 % MT SOLN
OROMUCOSAL | Status: AC
  Filled 2024-05-06: qty 15

## 2024-06-19 ENCOUNTER — Encounter: Payer: Self-pay | Admitting: Family Medicine

## 2024-06-19 ENCOUNTER — Ambulatory Visit (INDEPENDENT_AMBULATORY_CARE_PROVIDER_SITE_OTHER): Payer: Self-pay | Admitting: Family Medicine

## 2024-06-19 VITALS — BP 122/77 | HR 77 | Ht 63.0 in | Wt 132.0 lb

## 2024-06-19 DIAGNOSIS — R300 Dysuria: Secondary | ICD-10-CM

## 2024-06-19 LAB — POCT URINALYSIS DIP (MANUAL ENTRY)
Bilirubin, UA: NEGATIVE
Glucose, UA: NEGATIVE mg/dL
Ketones, POC UA: NEGATIVE mg/dL
Nitrite, UA: NEGATIVE
Protein Ur, POC: NEGATIVE mg/dL
Spec Grav, UA: 1.01 (ref 1.010–1.025)
Urobilinogen, UA: 0.2 U/dL
pH, UA: 5.5 (ref 5.0–8.0)

## 2024-06-19 LAB — POCT UA - MICROSCOPIC ONLY: WBC, Ur, HPF, POC: >20 (ref 0–5)

## 2024-06-19 MED ORDER — CEPHALEXIN 500 MG PO CAPS: 500.0000 mg | ORAL_CAPSULE | Freq: Two times a day (BID) | ORAL | 0 refills | Status: AC

## 2024-06-19 NOTE — Patient Instructions (Signed)
 It was wonderful to see you today.  Please bring ALL of your medications with you to every visit.   Today we talked about:  Im treating you for a UTI. Please take this medication twice daily for 7 days. If your urine culture shows you are resistant, we will change your antibiotic.   Thank you for choosing [facility].   Please call [phone] with any questions about today's appointment.  Please arrive at least 15 minutes prior to your scheduled appointments.   If you had blood work today, I will send you a MyChart message or a letter if results are normal. Otherwise, I will give you a call.   If you had a referral placed, they will call you to set up an appointment. Please give us  a call if you don't hear back in the next 2 weeks.   If you need additional refills before your next appointment, please call your pharmacy first.   You should follow up in our clinic in No follow-ups on file.  Gloriann Ogren, MD Family Medicine

## 2024-06-19 NOTE — Progress Notes (Unsigned)
    SUBJECTIVE:   CHIEF COMPLAINT / HPI:   Patient here for 3 day history dysuria and increased urinary frequency. Has to pee more frequently but less pee comes out. Tylenol  for pain with urination that helps a bit. Denies any back pain. Denies any blood in urine. Has history of BV but feels like this is not that. This feels like UTIs she's had in the past.   PERTINENT  PMH / PSH:    OBJECTIVE:   BP 122/77   Pulse 77   Ht 5' 3 (1.6 m)   Wt 132 lb (59.9 kg)   SpO2 100%   BMI 23.38 kg/m   General: A&O, NAD HEENT: No sign of trauma, EOM grossly intact GI: Soft, NTTP, non-distended Psych: Appropriate mood and affect   ASSESSMENT/PLAN:   Assessment & Plan Dysuria Likely UTI. UA with leukocytes. Given symptoms, will treat with keflex  500 mg BID for 7 days. Could consider follow up for STIs or BV if symptoms are not improving. Return precautions discussed     Gloriann Ogren, MD [facility]

## 2024-06-21 LAB — URINE CULTURE
